# Patient Record
Sex: Female | Born: 1955 | Race: White | Hispanic: No
Health system: Midwestern US, Academic
[De-identification: ages and names within clinical notes are randomized; demographics above are authoritative.]

---

## 2016-07-20 ENCOUNTER — Encounter: Admit: 2016-07-20 | Discharge: 2016-07-20 | Payer: No Typology Code available for payment source

## 2016-09-07 ENCOUNTER — Encounter: Admit: 2016-09-07 | Discharge: 2016-09-07 | Payer: No Typology Code available for payment source

## 2016-09-07 ENCOUNTER — Ambulatory Visit: Admit: 2016-09-07 | Discharge: 2016-09-08 | Payer: No Typology Code available for payment source

## 2016-09-07 DIAGNOSIS — Z780 Asymptomatic menopausal state: Principal | ICD-10-CM

## 2016-09-07 DIAGNOSIS — J301 Allergic rhinitis due to pollen: ICD-10-CM

## 2016-09-07 DIAGNOSIS — G43909 Migraine, unspecified, not intractable, without status migrainosus: ICD-10-CM

## 2016-09-07 DIAGNOSIS — Z1239 Encounter for other screening for malignant neoplasm of breast: ICD-10-CM

## 2016-09-07 DIAGNOSIS — Z79890 Hormone replacement therapy (postmenopausal): ICD-10-CM

## 2016-09-07 DIAGNOSIS — Z0000 Encounter for general adult medical examination without abnormal findings: ICD-10-CM

## 2016-09-07 MED ORDER — TRAMADOL 50 MG PO TAB
50 mg | ORAL_TABLET | Freq: Every day | ORAL | 0 refills | Status: AC | PRN
Start: 2016-09-07 — End: 2017-11-15

## 2016-09-07 MED ORDER — CONJ ESTROG-MEDROXYPROGEST ACE 0.3-1.5 MG PO TAB
1 | ORAL_TABLET | Freq: Every morning | ORAL | 1 refills | Status: AC
Start: 2016-09-07 — End: 2016-12-26

## 2016-09-07 MED ORDER — TOPIRAMATE 100 MG PO TAB
100 mg | ORAL_TABLET | Freq: Two times a day (BID) | ORAL | 0 refills | Status: AC
Start: 2016-09-07 — End: 2017-06-24

## 2016-09-08 LAB — COMPREHENSIVE METABOLIC PANEL
Lab: 0.5 mg/dL (ref 0.2–1.2)
Lab: 1.6 (calc) (ref 1.0–2.5)
Lab: 104 mmol/L (ref 98–110)
Lab: 116 mg/dL — ABNORMAL HIGH (ref 65–99)
Lab: 2.8 g/dL (ref 1.9–3.7)
Lab: 23 U/L (ref 6–29)
Lab: 27 U/L (ref 10–35)
Lab: 27 mmol/L (ref 20–31)
Lab: 4 mmol/L (ref 3.5–5.3)
Lab: 69 U/L (ref 33–130)
Lab: 7.4 g/dL (ref 6.1–8.1)
Lab: 9.9 mg/dL (ref 8.6–10.4)

## 2016-09-08 LAB — LIPID PROFILE
Lab: 128 mg/dL — ABNORMAL HIGH (ref >=60)
Lab: 165 mg/dL — ABNORMAL HIGH (ref <130)
Lab: 232 mg/dL — ABNORMAL HIGH (ref <150)
Lab: 236 mg/dL — ABNORMAL HIGH (ref <200)
Lab: 3.3 (calc) (ref <5.0)
Lab: 71 mg/dL (ref >50)

## 2016-09-18 ENCOUNTER — Encounter: Admit: 2016-09-18 | Discharge: 2016-09-18 | Payer: No Typology Code available for payment source

## 2016-09-18 DIAGNOSIS — Z83518 Family history of other specified eye disorder: Principal | ICD-10-CM

## 2016-09-18 NOTE — Telephone Encounter
If patient has an appointment she should not need a referral. If the office says she dose I am happy to place one but do not think she will need it. Please let me know thanks.What is the eye appointment for?

## 2016-09-18 NOTE — Telephone Encounter
Pt left VM requesting a referral for her eye appt with Dr. Karyl Kinnier at [facility] office) on Friday, 09/22/16. Routing to PCP. Please advise.   Margarita Rana, LPN

## 2016-09-19 ENCOUNTER — Encounter: Admit: 2016-09-19 | Discharge: 2016-09-19 | Payer: No Typology Code available for payment source

## 2016-09-19 NOTE — Telephone Encounter
Nurse spoke with pt. The eye dr told her to get a referral. Pt's mother has degenerative eye disease and is vision impaired. Pt has [place] [employer] and has a difficult time getting things covered.   Routing to PCP.   Margarita Rana, LPN

## 2016-09-19 NOTE — Telephone Encounter
Will place eye referral. Melinda Goodman can we please print the referral and ask where patient would like to pick it up or send it to?Thank you.

## 2016-09-19 NOTE — Telephone Encounter
Nurse called pt to notify her scripts were called in to pharmacy. Also to educate pt to not break Topiramate tablets but to take 1 tablet every 12 hrs. Pt verbalized understanding.   Margarita Rana, LPN

## 2016-09-28 ENCOUNTER — Ambulatory Visit: Admit: 2016-09-28 | Discharge: 2016-09-29 | Payer: No Typology Code available for payment source

## 2016-09-28 ENCOUNTER — Encounter: Admit: 2016-09-28 | Discharge: 2016-09-28 | Payer: No Typology Code available for payment source

## 2016-09-28 DIAGNOSIS — Z79890 Hormone replacement therapy (postmenopausal): ICD-10-CM

## 2016-09-28 DIAGNOSIS — G43009 Migraine without aura, not intractable, without status migrainosus: ICD-10-CM

## 2016-09-28 DIAGNOSIS — Z0000 Encounter for general adult medical examination without abnormal findings: ICD-10-CM

## 2016-09-28 DIAGNOSIS — Z124 Encounter for screening for malignant neoplasm of cervix: Principal | ICD-10-CM

## 2016-09-28 DIAGNOSIS — N898 Other specified noninflammatory disorders of vagina: ICD-10-CM

## 2016-09-28 NOTE — Progress Notes
Subjective:       History of Present Illness  Melinda Goodman is a 61 y.o. female with PMH of allergic rhinitis, DJD, migraines and non small cell lung cancer who presents to clinic today for her well woman exam.     Patient states that she is doing fine. She states that she has finished her boyfriends move. He is now living with her. She feels it is going well. She states that she is exercising doing water aerobics. She states that they have two dogs now in the house.    She states that she has been on prempro for >5 years. She states that she is worried about what symptoms might develop should she stop the prempro. She denies any nipple discharge, vaginal drainage, bleeding, breast lumps, rashes, lesions and denies any pain with intercourse.     In terms of her headaches she states that they are a little bit better on the topamax now but recently have been a little more frequent. She states that she has the same amount of caffeine every day but states that she has been sleeping less lately. She states that this is due to a different mattress.        Review of Systems   Constitutional: Negative for chills and fever.   Respiratory: Negative for shortness of breath.    Cardiovascular: Negative for chest pain.   Gastrointestinal: Negative for abdominal pain.   Genitourinary: Negative for menstrual problem, pelvic pain, vaginal bleeding, vaginal discharge and vaginal pain.     Past Medical History:   Diagnosis Date   ??? Allergic rhinitis 03/19/2006   ??? Cancer of lung (HCC)    ??? DJD (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   ??? HPV (human papillomavirus)     HISTORY OF   ??? Menopause 11/2003    HRT   ??? Migraine headache 03/19/2006   ??? Mood disorder (HCC) 03/19/2006   ??? Non-small cell lung cancer (HCC)          Objective:         ??? aspirin/acetaminophen/caffeine(+) (EXCEDRIN MIGRAINE) 250/250/65 mg tab Take 1 tablet by mouth.   ??? bran/gum/fib/cel/psyl/kelp/pec (FIBER 6 PO) Take  by mouth. ??? docosahexanoic acid/epa (FISH OIL PO) Take  by mouth.   ??? estrogens, conj/medroxyPROGESTERone(+) (PREMPRO) 0.3/1.5 mg tablet Take 1 tablet by mouth every morning.   ??? fexofenadine(+) (ALLEGRA) 180 mg PO tablet Take 180 mg by mouth Every Morning.   ??? glucosamine(+) 500 mg tab Take 500 mg by mouth three times daily with meals.   ??? IBUPROFEN PO Take  by mouth.   ??? ibuprofen/diphenhydramine cit (ADVIL PM PO) Take  by mouth.   ??? miconazole (MICONAZOLE 7) 2 % vaginal cream Apply 1 applicator topically to affected area at bedtime daily.   ??? topiramate (TOPAMAX) 100 mg tablet Take 1 tablet by mouth every 12 hours.   ??? traMADol (ULTRAM) 50 mg tablet Take 1 tablet by mouth daily as needed for Pain.     Vitals:    09/28/16 0824   BP: (!) 133/104   Pulse: 89   Resp: 13   Temp: 36.6 ???C (97.8 ???F)   TempSrc: Oral   Weight: 70.4 kg (155 lb 3.2 oz)   Height: 167.6 cm (66)     Body mass index is 25.05 kg/m???.     Physical Exam   Constitutional: She is oriented to person, place, and time. She appears well-developed and well-nourished. No distress.   HENT:   Head:  Normocephalic and atraumatic.   Cardiovascular: Normal rate, regular rhythm, normal heart sounds and intact distal pulses.    No murmur heard.  Pulmonary/Chest: Effort normal and breath sounds normal. No respiratory distress. She has no wheezes. She exhibits no mass and no tenderness. Right breast exhibits no inverted nipple, no mass, no nipple discharge, no skin change and no tenderness. Left breast exhibits no inverted nipple, no mass, no nipple discharge, no skin change and no tenderness.   Abdominal: Bowel sounds are normal.   Genitourinary: There is no rash or lesion on the right labia. There is no rash or lesion on the left labia. Uterus is not tender. Cervix exhibits no motion tenderness and no friability. Right adnexum displays no tenderness. Left adnexum displays no tenderness. Vaginal discharge (white discharge) found.   Musculoskeletal: She exhibits no edema. Neurological: She is alert and oriented to person, place, and time.   Skin: Skin is warm. She is not diaphoretic.   well healed incision of R axillae from prior biopsy    Psychiatric: She has a normal mood and affect. Her behavior is normal.            Assessment and Plan:    Problem   Hormone Replacement Therapy (Postmenopausal)    - longstanding use of PREMPRO (conj estrogens/medroxyPROGESTERONE)  - improves menstrual migraines and fatigue level as well as vaginal irritation/dryness  PLAN  - discussed potential side effects of therapy as well as possible decrease risk if we switch to transdermal however patient wanted to stay on prempro despite risks however she is willing to start taper now  -will decrease prempro to one tablet every other day for the next three months and then plan to space out further      Healthcare Maintenance    [name] is a 61 y.o. female with history of migraine headaches.     Age appropriate anticipatory guidance given  Vaccinations: up to date per patient  Colon Cancer screening: due in 2022  Breast cancer screening: due, mammogram ordered  Cervical Cancer screening: only one time abnormal, colposcopy was normal. pap smear completed today   Lung Cancer screening: history of lung cancer - cleared by Dr. Ladona Ridgel  Osteoporosis: DEXA due at age 24  Tobacco/Alcohol: not at risk user  HTN/HLD/DM: previously screened, blood pressure slightly elevated today however all prior blood pressures have been well controlled   Hepatitis: previously screened negative  HIV: declined, low risk  AAA: not a candidate  Hearing Loss: denies  *discussed with patient that she is due for shingles vaccine       Migraine Headache    - prior rx: topamax, effective at dose 200mg  in AM and 100mg  QHS.  - could not get insurance coverage for topamax .   - did have worsening headaches and fatigue off HRT  -09/28/16 improved back on topamax   PLAN   cont topamax 100mg  twice daily > patient has been counseled on risks of HRT we discussed again today and she is agreeable to start tapering off   > continue abortive therapy for migraines, patient taking tramadol rarely (script provided in 09/2016)--will not plan on giving any further for 6 months   -feel patient would also benefit from Neurology referral, however in discussion with patient today she states that she does not want to see Neurology at this time                Orders Placed This Encounter   ??? VAGINITIS  PANEL DNA PROBE QUEST   ??? CYTOLOGY PAP SMEAR  SCREENING today     Will have patient RTC in 6 months or sooner if needed for migraine follow up.       Patient Instructions           Thank you for coming to clinic today!  Make sure that you get your mammogram.  Will release your results through [facility] taking your prempro every OTHER day.  Would recommend getting a new mattress.  Your are due for shingles and tetanus shot. you can get either of these at your pharmacy or call our office to set up a nurse appointment.   If you have any questions or concerns, please feel free to call my nurse Burna Sis at [phone]  Take care,   Dr. Ellis Savage          On behalf of your physician and our staff, we are committed to providing you the best care we can.   In case of urgent care needs, we want bto share with you some options:    Appointments: We now offer SAME DAY appointments for all patients. Call [phone] for an appointment during business hours.   Office Hours:  Our office hours are 8:00 AM to 4:30 PM, Monday through Friday.  Illnesses: In the event of illness, please call your primary care physician prior to visiting an Urgent Care facility or Emergency Room as they can direct you to proper level of care. [facility] offers an After Hours Clinic for patients who need immediate care.  [facility]???s switchboard [phone]) is available to answer your urgent calls 24/7. In the event of an emergency, you should always call 911. After Hours:  In order to extend our primary care services beyond normal work hours, [facility] offer medical services in multiple After Hours Clinic locations.  Any General Medicine patient who needs immediate care can be seen at several Locations.    [facility]     [address], [place], [place], [place] [address]  [phone]  M-F 5:30 p.m.-9 p.m.  Sat-Sun 9 a.m.-1 p.m.    [facility]  [address] [place], [place] [address]  [phone]  M-F 9a.m.-9 p.m.  Sat-Sun 8 a.m.-4 p.m    [facility]    [address], [place], [place] [address]   [phone]  M-F 5 p.m.-9 p.m.  Sat-Sun 8 a.m.-2 p.m    [facility]  [address] [place], [place] [address]  [phone]  M-F 8 a.m.-5 p.m.  Sat-Sun CLOSED    After Hours Clinics Offer our Patients numerous benefits.   Providers at our After Hours Clinics have access to your electronic chart  Appointments may be scheduled to minimize wait time  Walk-ins also accommodated  Co-pays may be considerably less than those at Urgent Care center or an Emergency Room    [facility] After Hours Clinics also have a holiday schedule.  Please visit the Office Hours page at:   [email or web address]   for a list of holiday hours.  We look forward to partnering with you to help you achieve and maintain optimum health.    Sincerely, General Internal Medicine Clinic, [facility]. [phone]      No future appointments.

## 2016-10-04 ENCOUNTER — Encounter: Admit: 2016-10-04 | Discharge: 2016-10-04 | Payer: No Typology Code available for payment source

## 2016-10-06 ENCOUNTER — Encounter: Admit: 2016-10-06 | Discharge: 2016-10-06 | Payer: No Typology Code available for payment source

## 2016-10-06 DIAGNOSIS — Z124 Encounter for screening for malignant neoplasm of cervix: Principal | ICD-10-CM

## 2016-10-23 ENCOUNTER — Encounter: Admit: 2016-10-23 | Discharge: 2016-10-23 | Payer: No Typology Code available for payment source

## 2016-12-04 ENCOUNTER — Ambulatory Visit: Admit: 2016-12-04 | Discharge: 2016-12-04 | Payer: No Typology Code available for payment source

## 2016-12-04 ENCOUNTER — Encounter: Admit: 2016-12-04 | Discharge: 2016-12-04 | Payer: No Typology Code available for payment source

## 2016-12-04 ENCOUNTER — Ambulatory Visit: Admit: 2016-12-04 | Discharge: 2016-12-05 | Payer: No Typology Code available for payment source

## 2016-12-04 DIAGNOSIS — G43909 Migraine, unspecified, not intractable, without status migrainosus: Principal | ICD-10-CM

## 2016-12-04 DIAGNOSIS — M25521 Pain in right elbow: ICD-10-CM

## 2016-12-04 DIAGNOSIS — M7711 Lateral epicondylitis, right elbow: Principal | ICD-10-CM

## 2016-12-04 DIAGNOSIS — M7701 Medial epicondylitis, right elbow: Principal | ICD-10-CM

## 2016-12-04 DIAGNOSIS — F39 Unspecified mood [affective] disorder: ICD-10-CM

## 2016-12-04 DIAGNOSIS — J309 Allergic rhinitis, unspecified: ICD-10-CM

## 2016-12-04 DIAGNOSIS — Z780 Asymptomatic menopausal state: ICD-10-CM

## 2016-12-04 DIAGNOSIS — M1990 Unspecified osteoarthritis, unspecified site: ICD-10-CM

## 2016-12-04 DIAGNOSIS — Z1239 Encounter for other screening for malignant neoplasm of breast: ICD-10-CM

## 2016-12-04 DIAGNOSIS — C3490 Malignant neoplasm of unspecified part of unspecified bronchus or lung: Secondary | ICD-10-CM

## 2016-12-04 DIAGNOSIS — Z23 Encounter for immunization: ICD-10-CM

## 2016-12-04 NOTE — Progress Notes
Date of Service: 12/04/2016    Melinda Goodman is a 61 y.o. female. DOB: [birth year]   MRN#: [id number]    Subjective:   Chief Complaint   Patient presents with   ??? Elbow Pain          History of Present Illness  Melinda Goodman is 61 y.o. patient who presents to clinic for urgent visit.    She is having pain in her right elbow.  She started golfing over the summer and the pain gradually started then.  It has gotten worse in the last month.  The pain sometimes wakes her up at night.  She has been applying heat and taking Advil or Aleve as needed.  She still has full range of motion and has still been physically active.  She played tennis when she was younger and had similar pain when she had tennis elbow.  The pain sometimes travels down her lower arm to her wrist.      Past Medical History:   Diagnosis Date   ??? Allergic rhinitis 03/19/2006   ??? Cancer of lung (HCC)    ??? DJD (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   ??? HPV (human papillomavirus)     HISTORY OF   ??? Menopause 11/2003    HRT   ??? Migraine headache 03/19/2006   ??? Mood disorder (HCC) 03/19/2006   ??? Non-small cell lung cancer [facility])        Past Surgical History:   Procedure Laterality Date   ??? HX PNEUMONECTOMY     ??? HX TONSILLECTOMY     ??? SHOULDER SURGERY         family history includes Cancer-Prostate in her father.    Social History     Social History   ??? Marital status: Divorced     Spouse name: N/A   ??? Number of children: N/A   ??? Years of education: N/A     Occupational History   ??? teacher, diving [employer]     Social History Main Topics   ??? Smoking status: Never Smoker   ??? Smokeless tobacco: Never Used      Comment: Patient expieremented in college   ??? Alcohol use Yes      Comment: RARE   ??? Drug use: No      Comment: HX: Marijuana   ??? Sexual activity: Yes     Partners: Male     Birth control/ protection: Post-menopausal     Other Topics Concern   ??? Not on file     Social History Narrative   ??? No narrative on file       Social History Substance Use Topics   ??? Smoking status: Never Smoker   ??? Smokeless tobacco: Never Used      Comment: Patient expieremented in college   ??? Alcohol use Yes      Comment: RARE            Review of Systems   Constitution: Negative for chills and fever.   Cardiovascular: Negative for chest pain and dyspnea on exertion.   Respiratory: Negative for cough and shortness of breath.    Musculoskeletal: Positive for joint pain, muscle weakness and myalgias. Negative for falls and joint swelling.   Neurological: Negative for dizziness and headaches.   All other systems reviewed and are negative.        Objective:     ??? aspirin/acetaminophen/caffeine(+) (EXCEDRIN MIGRAINE) 250/250/65 mg tab Take 1 tablet by mouth.   ???  bran/gum/fib/cel/psyl/kelp/pec (FIBER 6 PO) Take  by mouth.   ??? docosahexanoic acid/epa (FISH OIL PO) Take  by mouth.   ??? estrogens, conj/medroxyPROGESTERone(+) (PREMPRO) 0.3/1.5 mg tablet Take 1 tablet by mouth every morning.   ??? fexofenadine(+) (ALLEGRA) 180 mg PO tablet Take 180 mg by mouth Every Morning.   ??? glucosamine(+) 500 mg tab Take 500 mg by mouth three times daily with meals.   ??? IBUPROFEN PO Take  by mouth.   ??? ibuprofen/diphenhydramine cit (ADVIL PM PO) Take  by mouth.   ??? topiramate (TOPAMAX) 100 mg tablet Take 1 tablet by mouth every 12 hours.   ??? traMADol (ULTRAM) 50 mg tablet Take 1 tablet by mouth daily as needed for Pain.     Vitals:    12/04/16 1336   BP: 117/82   Pulse: 97   Resp: 16   Temp: 36.4 ???C (97.6 ???F)   TempSrc: Oral   Weight: 71.8 kg (158 lb 6.4 oz)   Height: 167.6 cm (66)     Body mass index is 25.57 kg/m???.     BP Readings from Last 5 Encounters:   12/04/16 117/82   09/28/16 (!) 133/104   09/07/16 127/82   04/13/16 123/87   02/17/16 139/84     Wt Readings from Last 5 Encounters:   12/04/16 71.8 kg (158 lb 6.4 oz)   09/28/16 70.4 kg (155 lb 3.2 oz)   09/07/16 69.9 kg (154 lb 3.2 oz)   04/13/16 66.5 kg (146 lb 9.6 oz)   02/17/16 66 kg (145 lb 6.4 oz)         Physical Exam Constitutional: She is oriented to person, place, and time. She appears well-developed and well-nourished.   HENT:   Head: Normocephalic and atraumatic.   Neck: Normal range of motion.   Cardiovascular: Normal rate, regular rhythm, normal heart sounds and intact distal pulses.    Pulmonary/Chest: Effort normal and breath sounds normal. No respiratory distress.   Musculoskeletal: Normal range of motion.        Right elbow: She exhibits normal range of motion, no swelling, no effusion, no deformity and no laceration. Tenderness found. Lateral epicondyle tenderness noted.   Neurological: She is alert and oriented to person, place, and time.   Skin: Skin is warm and dry.   Psychiatric: She has a normal mood and affect. Her behavior is normal.   Nursing note and vitals reviewed.      Greater than 25 minutes spent with pt with >50% of the time counseling and coordinating care.  This consultation time included discussion of medical issues, interpretation of data and educating patient as to condition and treatment options (and potential risks and benefits).         Assessment and Plan:    Lateral epicondylitis, right elbow  Right elbow pain  ??? X-ray right elbow today  ??? Referral placed to physical therapy  ??? Continue NSAIDs, stretching, ice, and heat    Need for immunization against influenza  ??? - FLU VACCINE =>6 MONTHS QUADRIVALENT PF    Breast cancer screening  ??? Order placed for routine mammogram per patient request    Orders Placed This Encounter   ??? ELBOW MIN 3 VIEWS RIGHT   ??? MAMMO SCREEN BILAT   ??? influenza (=>6 months) QUADrivalent (FLULAVAL, FLUARIX) vaccine PF   ??? PHYSICAL THERAPY     Future Appointments  Date Time Provider Department Center   12/04/2016 2:30 PM GENERAL ROOM [facility]  Patient Instructions   It was nice to see you today! Thank you for coming into our clinic.      Our plan:  ??? Physical therapy ordered  ??? Continue ibuprofen/naproxen ??? Xray today. Stop by the 2nd floor on your way out. I will call or MyChart you regarding the results in the next few days.         Please notify the clinic or go to the emergency room if you develop sudden worsening of symptoms, fevers, chest pain, difficulty breathing, or other concerning symptoms.     Please call my nurse if you have any further questions or concerns. Her name is Leotis Shames, and her direct phone number is [phone]. You can leave a voicemail if needed.  You can also reach [place] by sending a secure email message through MyChart.    Helpful Information:  ??? We will contact you by MyChart or phone with any test results when they are available.   ??? If you are expecting results and have not heard from [place] within 2 weeks of your testing, please send a MyChart message or call the clinic.  ??? Please use the MyChart Refill request or contact your pharmacy directly to request medication refills. - Please allow at least 72 hours for refill requests.  ??? For appointments: Our Scheduling phone number is [phone].        Your satisfaction with the care you received today is important to me. Please call us directly at [phone] if there are any concerns that we can address.       Again, it was great to see you today.    Take care,  Linna Caprice, APRN        No future appointments.          Linna Caprice, APRN-NP

## 2016-12-05 ENCOUNTER — Encounter: Admit: 2016-12-05 | Discharge: 2016-12-05 | Payer: No Typology Code available for payment source

## 2016-12-05 NOTE — Telephone Encounter
"  Please notify patient that her xray looks normal   Thank you!   Ellison Hughs "    Nurse relayed message. Pt understood. Nurse informed pt to call [phone] or to send message through [facility], if there are future questions or concerns.  Marjory Lies, LPN

## 2016-12-08 ENCOUNTER — Encounter: Admit: 2016-12-08 | Discharge: 2016-12-08 | Payer: No Typology Code available for payment source

## 2016-12-08 DIAGNOSIS — M7711 Lateral epicondylitis, right elbow: ICD-10-CM

## 2016-12-08 DIAGNOSIS — M7701 Medial epicondylitis, right elbow: Principal | ICD-10-CM

## 2016-12-08 DIAGNOSIS — M25521 Pain in right elbow: ICD-10-CM

## 2016-12-11 ENCOUNTER — Ambulatory Visit: Admit: 2016-12-11 | Discharge: 2016-12-11 | Payer: No Typology Code available for payment source

## 2016-12-11 DIAGNOSIS — Z1231 Encounter for screening mammogram for malignant neoplasm of breast: Principal | ICD-10-CM

## 2016-12-18 ENCOUNTER — Encounter: Admit: 2016-12-18 | Discharge: 2016-12-18 | Payer: No Typology Code available for payment source

## 2016-12-18 DIAGNOSIS — Z780 Asymptomatic menopausal state: Principal | ICD-10-CM

## 2016-12-18 MED ORDER — PREMPRO 0.3-1.5 MG PO TAB
ORAL_TABLET | Freq: Every morning | 1 refills
Start: 2016-12-18 — End: ?

## 2016-12-20 ENCOUNTER — Ambulatory Visit: Admit: 2016-12-20 | Discharge: 2016-12-20 | Payer: No Typology Code available for payment source

## 2016-12-22 ENCOUNTER — Encounter: Admit: 2016-12-22 | Discharge: 2016-12-22 | Payer: No Typology Code available for payment source

## 2016-12-22 DIAGNOSIS — Z780 Asymptomatic menopausal state: Principal | ICD-10-CM

## 2016-12-22 NOTE — Telephone Encounter
Pt's pharmacy requesting a refill for Prempro 0.3-1.5mg  tablet #90x1. Last filled 09/07/16 #90x1. Last office visit 09/28/16. No future visit scheduled. Medication not on standing orders.   Routing to PCP for approval.  Margarita Rana, LPN

## 2016-12-24 NOTE — Telephone Encounter
Melinda Goodman, can we please ask patient how she has been doing taking her medication every other day? We were hoping for her to taper off of this, as this is what would be safest for her. THank you.

## 2016-12-25 MED ORDER — CONJ ESTROG-MEDROXYPROGEST ACE 0.3-1.5 MG PO TAB
1 | ORAL_TABLET | ORAL | 0 refills | Status: AC
Start: 2016-12-25 — End: 2017-03-01

## 2016-12-25 NOTE — Telephone Encounter
Nurse called pt and left vm relaying previous message and requesting pt return call.   Margarita Rana, LPN

## 2016-12-25 NOTE — Telephone Encounter
Pt left vm stating she is fine taking the Prempro every other day.   Margarita Rana, LPN

## 2016-12-26 NOTE — Telephone Encounter
Please ask patient to try and taper further if she is able to, as I would like her to be off of this as soon as possible.

## 2016-12-26 NOTE — Telephone Encounter
Nurse called pt and relayed previous message. Pt does not want to taper off any more at this point. Pt stated she tried to get off of it but had too many symptoms. Pt would like to stay on the every other day cycle for a while.  Margarita Rana, LPN

## 2017-01-04 ENCOUNTER — Ambulatory Visit: Admit: 2017-01-04 | Discharge: 2017-01-04 | Payer: No Typology Code available for payment source

## 2017-01-10 ENCOUNTER — Ambulatory Visit: Admit: 2017-01-10 | Discharge: 2017-01-10 | Payer: No Typology Code available for payment source

## 2017-01-19 ENCOUNTER — Ambulatory Visit: Admit: 2017-01-19 | Discharge: 2017-01-19 | Payer: No Typology Code available for payment source

## 2017-01-23 ENCOUNTER — Ambulatory Visit: Admit: 2017-01-23 | Discharge: 2017-01-23 | Payer: No Typology Code available for payment source

## 2017-02-28 ENCOUNTER — Encounter: Admit: 2017-02-28 | Discharge: 2017-02-28 | Payer: No Typology Code available for payment source

## 2017-02-28 DIAGNOSIS — Z780 Asymptomatic menopausal state: Principal | ICD-10-CM

## 2017-03-01 MED ORDER — CONJ ESTROG-MEDROXYPROGEST ACE 0.3-1.5 MG PO TAB
ORAL_TABLET | ORAL | 0 refills | Status: AC
Start: 2017-03-01 — End: ?

## 2017-03-19 ENCOUNTER — Encounter: Admit: 2017-03-19 | Discharge: 2017-03-19 | Payer: No Typology Code available for payment source

## 2017-03-20 ENCOUNTER — Encounter: Admit: 2017-03-20 | Discharge: 2017-03-20 | Payer: No Typology Code available for payment source

## 2017-03-20 ENCOUNTER — Ambulatory Visit: Admit: 2017-03-20 | Discharge: 2017-03-21 | Payer: Private Health Insurance - Indemnity

## 2017-03-20 DIAGNOSIS — C3490 Malignant neoplasm of unspecified part of unspecified bronchus or lung: ICD-10-CM

## 2017-03-20 DIAGNOSIS — Z780 Asymptomatic menopausal state: ICD-10-CM

## 2017-03-20 DIAGNOSIS — F39 Unspecified mood [affective] disorder: ICD-10-CM

## 2017-03-20 DIAGNOSIS — M7711 Lateral epicondylitis, right elbow: ICD-10-CM

## 2017-03-20 DIAGNOSIS — J309 Allergic rhinitis, unspecified: ICD-10-CM

## 2017-03-20 DIAGNOSIS — M1990 Unspecified osteoarthritis, unspecified site: ICD-10-CM

## 2017-03-20 DIAGNOSIS — G43909 Migraine, unspecified, not intractable, without status migrainosus: Principal | ICD-10-CM

## 2017-03-20 DIAGNOSIS — Z79890 Hormone replacement therapy (postmenopausal): ICD-10-CM

## 2017-03-20 DIAGNOSIS — R232 Flushing: Principal | ICD-10-CM

## 2017-03-20 MED ORDER — VENLAFAXINE 37.5 MG PO TAB
37.5 mg | ORAL_TABLET | Freq: Every day | ORAL | 0 refills | Status: AC
Start: 2017-03-20 — End: 2017-06-25

## 2017-06-20 ENCOUNTER — Encounter: Admit: 2017-06-20 | Discharge: 2017-06-20 | Payer: No Typology Code available for payment source

## 2017-06-20 DIAGNOSIS — G43709 Chronic migraine without aura, not intractable, without status migrainosus: Principal | ICD-10-CM

## 2017-06-24 MED ORDER — TOPIRAMATE 100 MG PO TAB
100 mg | ORAL_TABLET | Freq: Two times a day (BID) | ORAL | 0 refills | Status: AC
Start: 2017-06-24 — End: 2017-11-15

## 2017-06-25 ENCOUNTER — Encounter: Admit: 2017-06-25 | Discharge: 2017-06-25 | Payer: No Typology Code available for payment source

## 2017-06-25 DIAGNOSIS — R232 Flushing: Principal | ICD-10-CM

## 2017-06-25 MED ORDER — VENLAFAXINE 37.5 MG PO TAB
37.5 mg | ORAL_TABLET | Freq: Every day | ORAL | 0 refills | Status: AC
Start: 2017-06-25 — End: 2017-11-15

## 2017-11-15 ENCOUNTER — Encounter: Admit: 2017-11-15 | Discharge: 2017-11-15 | Payer: No Typology Code available for payment source

## 2017-11-15 DIAGNOSIS — G43009 Migraine without aura, not intractable, without status migrainosus: Secondary | ICD-10-CM

## 2017-11-15 DIAGNOSIS — M7711 Lateral epicondylitis, right elbow: ICD-10-CM

## 2017-11-15 DIAGNOSIS — F419 Anxiety disorder, unspecified: Secondary | ICD-10-CM

## 2017-11-15 DIAGNOSIS — F39 Unspecified mood [affective] disorder: Secondary | ICD-10-CM

## 2017-11-15 DIAGNOSIS — G43909 Migraine, unspecified, not intractable, without status migrainosus: Principal | ICD-10-CM

## 2017-11-15 DIAGNOSIS — B977 Papillomavirus as the cause of diseases classified elsewhere: ICD-10-CM

## 2017-11-15 DIAGNOSIS — Z8669 Personal history of other diseases of the nervous system and sense organs: ICD-10-CM

## 2017-11-15 DIAGNOSIS — Z780 Asymptomatic menopausal state: ICD-10-CM

## 2017-11-15 DIAGNOSIS — M1990 Unspecified osteoarthritis, unspecified site: ICD-10-CM

## 2017-11-15 DIAGNOSIS — H902 Conductive hearing loss, unspecified: ICD-10-CM

## 2017-11-15 DIAGNOSIS — E785 Hyperlipidemia, unspecified: ICD-10-CM

## 2017-11-15 DIAGNOSIS — N390 Urinary tract infection, site not specified: ICD-10-CM

## 2017-11-15 DIAGNOSIS — R232 Flushing: ICD-10-CM

## 2017-11-15 DIAGNOSIS — J302 Other seasonal allergic rhinitis: ICD-10-CM

## 2017-11-15 DIAGNOSIS — C3490 Malignant neoplasm of unspecified part of unspecified bronchus or lung: Secondary | ICD-10-CM

## 2017-11-15 MED ORDER — RIBOFLAVIN (VITAMIN B2) 400 MG PO TAB
400 mg | ORAL_TABLET | Freq: Every day | ORAL | 1 refills | Status: AC
Start: 2017-11-15 — End: 2018-11-18

## 2017-11-15 MED ORDER — TOPIRAMATE 100 MG PO TAB
100 mg | ORAL_TABLET | Freq: Two times a day (BID) | ORAL | 3 refills | Status: AC
Start: 2017-11-15 — End: 2018-11-18

## 2017-11-15 MED ORDER — ESCITALOPRAM OXALATE 10 MG PO TAB
10 mg | ORAL_TABLET | Freq: Every day | ORAL | 1 refills | Status: AC
Start: 2017-11-15 — End: 2018-05-14

## 2017-11-15 MED ORDER — VARICELLA-ZOSTER GE-AS01B (PF) 50 MCG/0.5 ML IM SUSR
.5 mL | Freq: Once | INTRAMUSCULAR | 1 refills | Status: AC
Start: 2017-11-15 — End: ?

## 2017-11-15 MED ORDER — SUMATRIPTAN SUCCINATE 25 MG PO TAB
25 mg | ORAL_TABLET | ORAL | 5 refills | Status: AC | PRN
Start: 2017-11-15 — End: 2018-11-18

## 2017-11-16 ENCOUNTER — Ambulatory Visit: Admit: 2017-11-15 | Discharge: 2017-11-16 | Payer: Private Health Insurance - Indemnity

## 2017-11-16 DIAGNOSIS — J302 Other seasonal allergic rhinitis: ICD-10-CM

## 2017-11-16 DIAGNOSIS — Z8669 Personal history of other diseases of the nervous system and sense organs: ICD-10-CM

## 2017-11-16 DIAGNOSIS — B977 Papillomavirus as the cause of diseases classified elsewhere: ICD-10-CM

## 2017-11-16 DIAGNOSIS — Z114 Encounter for screening for human immunodeficiency virus [HIV]: ICD-10-CM

## 2017-11-16 DIAGNOSIS — J301 Allergic rhinitis due to pollen: ICD-10-CM

## 2017-11-16 DIAGNOSIS — C3431 Malignant neoplasm of lower lobe, right bronchus or lung: ICD-10-CM

## 2017-11-16 DIAGNOSIS — E785 Hyperlipidemia, unspecified: ICD-10-CM

## 2017-11-16 DIAGNOSIS — Z23 Encounter for immunization: ICD-10-CM

## 2017-11-16 DIAGNOSIS — Z79890 Hormone replacement therapy (postmenopausal): ICD-10-CM

## 2017-11-16 DIAGNOSIS — F5105 Insomnia due to other mental disorder: ICD-10-CM

## 2017-11-16 DIAGNOSIS — H902 Conductive hearing loss, unspecified: ICD-10-CM

## 2017-11-16 DIAGNOSIS — R232 Flushing: ICD-10-CM

## 2017-11-16 DIAGNOSIS — Z0000 Encounter for general adult medical examination without abnormal findings: Principal | ICD-10-CM

## 2017-11-16 DIAGNOSIS — Z1239 Encounter for other screening for malignant neoplasm of breast: ICD-10-CM

## 2017-11-16 DIAGNOSIS — Z01419 Encounter for gynecological examination (general) (routine) without abnormal findings: ICD-10-CM

## 2017-11-16 DIAGNOSIS — G43709 Chronic migraine without aura, not intractable, without status migrainosus: ICD-10-CM

## 2017-11-16 DIAGNOSIS — G43109 Migraine with aura, not intractable, without status migrainosus: ICD-10-CM

## 2017-11-16 DIAGNOSIS — M150 Primary generalized (osteo)arthritis: ICD-10-CM

## 2017-11-19 ENCOUNTER — Ambulatory Visit: Admit: 2017-11-19 | Discharge: 2017-11-19 | Payer: Private Health Insurance - Indemnity

## 2017-11-19 DIAGNOSIS — Z01419 Encounter for gynecological examination (general) (routine) without abnormal findings: Principal | ICD-10-CM

## 2017-11-19 LAB — LIPID PROFILE
Lab: 188 mg/dL — ABNORMAL HIGH (ref <100)
Lab: 221 mg/dL — ABNORMAL HIGH (ref <150)
Lab: 233 mg/dL (ref 6.0–8.0)
Lab: 288 mg/dL — ABNORMAL HIGH (ref <200)

## 2017-11-19 LAB — COMPREHENSIVE METABOLIC PANEL
Lab: 0.4 mg/dL (ref 0.3–1.2)
Lab: 143 MMOL/L (ref 137–147)
Lab: 4.1 MMOL/L (ref 3.5–5.1)
Lab: 4.5 g/dL (ref 3.5–5.0)
Lab: 85 U/L (ref 25–110)

## 2017-11-19 LAB — TSH WITH FREE T4 REFLEX: Lab: 3.5 uU/mL (ref >40)

## 2018-02-20 ENCOUNTER — Ambulatory Visit: Admit: 2018-02-20 | Discharge: 2018-02-20 | Payer: No Typology Code available for payment source

## 2018-02-20 DIAGNOSIS — Z1231 Encounter for screening mammogram for malignant neoplasm of breast: Secondary | ICD-10-CM

## 2018-05-14 ENCOUNTER — Encounter: Admit: 2018-05-14 | Discharge: 2018-05-14 | Payer: No Typology Code available for payment source

## 2018-05-14 MED ORDER — ESCITALOPRAM OXALATE 10 MG PO TAB
10 mg | ORAL_TABLET | Freq: Every day | ORAL | 1 refills | Status: DC
Start: 2018-05-14 — End: 2018-11-18

## 2018-05-14 MED ORDER — ESCITALOPRAM OXALATE 10 MG PO TAB
ORAL_TABLET | Freq: Every day | 1 refills | Status: DC
Start: 2018-05-14 — End: 2018-05-14

## 2018-05-14 NOTE — Telephone Encounter
Refilled per protocol.  Darby Shadwick, LPN

## 2018-11-18 ENCOUNTER — Encounter: Admit: 2018-11-18 | Discharge: 2018-11-18 | Payer: No Typology Code available for payment source

## 2018-11-18 ENCOUNTER — Ambulatory Visit: Admit: 2018-11-18 | Discharge: 2018-11-18 | Payer: No Typology Code available for payment source

## 2018-11-18 DIAGNOSIS — F39 Unspecified mood [affective] disorder: Secondary | ICD-10-CM

## 2018-11-18 DIAGNOSIS — R5383 Other fatigue: Secondary | ICD-10-CM

## 2018-11-18 DIAGNOSIS — M7711 Lateral epicondylitis, right elbow: Secondary | ICD-10-CM

## 2018-11-18 DIAGNOSIS — Z8669 Personal history of other diseases of the nervous system and sense organs: Secondary | ICD-10-CM

## 2018-11-18 DIAGNOSIS — R232 Flushing: Secondary | ICD-10-CM

## 2018-11-18 DIAGNOSIS — B977 Papillomavirus as the cause of diseases classified elsewhere: Secondary | ICD-10-CM

## 2018-11-18 DIAGNOSIS — Z780 Asymptomatic menopausal state: Secondary | ICD-10-CM

## 2018-11-18 DIAGNOSIS — H902 Conductive hearing loss, unspecified: Secondary | ICD-10-CM

## 2018-11-18 DIAGNOSIS — M1990 Unspecified osteoarthritis, unspecified site: Secondary | ICD-10-CM

## 2018-11-18 DIAGNOSIS — Z0000 Encounter for general adult medical examination without abnormal findings: Secondary | ICD-10-CM

## 2018-11-18 DIAGNOSIS — J302 Other seasonal allergic rhinitis: Secondary | ICD-10-CM

## 2018-11-18 DIAGNOSIS — N390 Urinary tract infection, site not specified: Secondary | ICD-10-CM

## 2018-11-18 DIAGNOSIS — C3490 Malignant neoplasm of unspecified part of unspecified bronchus or lung: Secondary | ICD-10-CM

## 2018-11-18 DIAGNOSIS — G43909 Migraine, unspecified, not intractable, without status migrainosus: Secondary | ICD-10-CM

## 2018-11-18 DIAGNOSIS — F419 Anxiety disorder, unspecified: Secondary | ICD-10-CM

## 2018-11-18 DIAGNOSIS — E785 Hyperlipidemia, unspecified: Secondary | ICD-10-CM

## 2018-11-18 DIAGNOSIS — G43709 Chronic migraine without aura, not intractable, without status migrainosus: Secondary | ICD-10-CM

## 2018-11-18 LAB — CBC AND DIFF
Lab: 11 g/dL — ABNORMAL LOW (ref 12.0–15.0)
Lab: 13 % (ref 11–15)
Lab: 29 pg (ref 26–34)
Lab: 3.9 M/UL — ABNORMAL LOW (ref 4.0–5.0)
Lab: 33 g/dL (ref 32.0–36.0)
Lab: 34 % — ABNORMAL LOW (ref 36–45)
Lab: 4.7 10*3/uL — ABNORMAL HIGH (ref <100)
Lab: 88 FL — ABNORMAL HIGH (ref 80–100)

## 2018-11-18 LAB — TSH WITH FREE T4 REFLEX: Lab: 3.7 uU/mL (ref <150)

## 2018-11-18 LAB — LIPID PROFILE: Lab: 178 mg/dL (ref <200)

## 2018-11-18 LAB — COMPREHENSIVE METABOLIC PANEL: Lab: 142 MMOL/L — ABNORMAL LOW (ref >40)

## 2018-11-18 MED ORDER — TOPIRAMATE 100 MG PO TAB
100 mg | ORAL_TABLET | Freq: Two times a day (BID) | ORAL | 3 refills | Status: AC
Start: 2018-11-18 — End: ?

## 2018-11-18 MED ORDER — ESCITALOPRAM OXALATE 10 MG PO TAB
10 mg | ORAL_TABLET | Freq: Every day | ORAL | 3 refills | Status: AC
Start: 2018-11-18 — End: ?

## 2018-11-18 NOTE — Progress Notes
Health Maintenance Due   Topic Date Due    SHINGLES RECOMBINANT VACCINE (1 of 2) 10/30/2004    INFLUENZA VACCINE  09/07/2018    PHYSICAL (COMPREHENSIVE) EXAM  11/16/2018        Notes to provider:    Pt is here for medication refill her medications.     Ashley Jacobs, LPN

## 2018-11-18 NOTE — Progress Notes
Date of Service: 11/18/2018    Melinda Goodman is a 63 y.o. female. DOB: Oct 23, 1955   MRN#: [id number]    Subjective:   Chief Complaint   Patient presents with   ? Medication Refill          History of Present Illness  Melinda Goodman is 63 y.o. patient who presents to clinic for follow-up/refills.    She needs a refill on Lexapro?takes this for hot flashes, these are well controlled.  Mood is also good.  Also needs a refill on Topamax, takes this for migraines, also well controlled.  She has been feeling a little sick for the last couple weeks, some congestion and cough, very fatigued.  She was tested for COVID and this was negative.  She is feeling a little better over the last couple days.  Declines full physical today but is agreeable to annual labs.  She does report that when she got her flu shot and tetanus booster here last year she had to pay over $300.  She has lost about 30 pounds over the last year, she has been doing meal replacement shakes since June.    BP Readings from Last 5 Encounters:   11/18/18 95/61   11/15/17 121/85   03/20/17 (P) 123/72   12/04/16 117/82   09/28/16 (!) 133/104     Wt Readings from Last 5 Encounters:   11/18/18 59.9 kg (132 lb)   11/15/17 72.7 kg (160 lb 3.2 oz)   03/20/17 (P) 75.3 kg (166 lb)   12/04/16 71.8 kg (158 lb 6.4 oz)   09/28/16 70.4 kg (155 lb 3.2 oz)     Medical History:   Diagnosis Date   ? Conductive hearing loss 02/18/2016     - audiology referral   ? DJD (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   ? 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 HLD (hyperlipidemia)    ? Hot flashes 03/21/2017    11/15/2017  venlafaxine      ? 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   ? Lateral epicondylitis of right elbow 03/21/2017    started 11/2016, improved with PT 03/2017 coming back slightly  likes to golf and play tennis  PLAN encouraged patient that she may need to consider repeat PT  she will consider this and let me know if she would like to pursue PT again    ? Lung cancer (HCC) 02/24/2009     Patient was in her usual state of health until November, 2010 when she began having shoulder pain consistent with adhesive capsulitis. She had had adhesive capsulitis in the other shoulder and had to have it treated under anesthetic. She has had progressive pain which interferes with her sleep and activities. An xray was done showing a spiculated pulmonary nodule and she has undergone evaluation.   ? Menopause 11/2003    HRT   ? Migraine headache 03/19/2006    topamax; excedrin migraine prn; migraine 3-4x/ day; imitrex prn    ?  Mood disorder (HCC) 03/19/2006   ? Non-small cell lung cancer (HCC)    ? Recurrent UTI 02/18/2016    - no urinary symptoms at this time, though this has been a problem in the past - encouraged patient to call if develops symptoms   ? Seasonal allergies     allegra qday       Surgical History:   Procedure Laterality Date   ? HX PNEUMONECTOMY      RLL   ? HX TONSILLECTOMY     ? SHOULDER SURGERY         family history includes Blindness in her mother; Cancer-Breast in her maternal aunt, maternal grandmother, and paternal grandmother; Cancer-Prostate in her father; Diabetes in her maternal grandmother; GI Problem in her father; High Cholesterol in her father and mother; Hypertension in her father and mother; Macular Degen in her mother; Obesity in her brother and mother; Other in her brother.    Social History Socioeconomic History   ? Marital status: Divorced     Spouse name: Not on file   ? Number of children: Not on file   ? Years of education: Not on file   ? Highest education level: Not on file   Occupational History   ? Occupation: Runner, broadcasting/film/video, Teacher, adult education: [employer]     Comment: retired   Tobacco Use   ? Smoking status: Never Smoker   ? Smokeless tobacco: Never Used   ? Tobacco comment: Patient expieremented in college   Substance and Sexual Activity   ? Alcohol use: Yes     Alcohol/week: 2.0 standard drinks     Types: 2 Shots of liquor per week     Comment:     ? Drug use: No     Comment: HX: Marijuana   ? Sexual activity: Yes     Partners: Male     Birth control/protection: Post-menopausal   Other Topics Concern   ? Not on file   Social History Narrative   ? Not on file       Social History     Tobacco Use   ? Smoking status: Never Smoker   ? Smokeless tobacco: Never Used   ? Tobacco comment: Patient expieremented in college   Substance Use Topics   ? Alcohol use: Yes     Alcohol/week: 2.0 standard drinks     Types: 2 Shots of liquor per week     Comment:              Review of Systems   Constitution: Positive for weight loss (intentional). Negative for chills and fever.   Cardiovascular: Negative for chest pain and dyspnea on exertion.   Respiratory: Positive for cough. Negative for shortness of breath.         Chest congestion    Neurological: Negative for dizziness and headaches.   All other systems reviewed and are negative.      Objective:     ? escitalopram oxalate (LEXAPRO) 10 mg tablet Take one tablet by mouth daily.   ? fexofenadine(+) (ALLEGRA) 180 mg PO tablet Take 180 mg by mouth Every Morning.   ? ibuprofen/diphenhydramine cit (ADVIL PM PO) Take  by mouth.   ? topiramate (TOPAMAX) 100 mg tablet Take one tablet by mouth twice daily.     Vitals:    11/18/18 0838   BP: 95/61   Pulse: 75   Resp: 18   Temp: 36.4 ?C (97.6 ?F)  TempSrc: Oral   SpO2: 98% Weight: 59.9 kg (132 lb)   Height: 170.2 cm (67)   PainSc: Zero     Body mass index is 20.67 kg/m?Marland Kitchen     Physical Exam  Vitals signs and nursing note reviewed.   Constitutional:       General: She is not in acute distress.     Appearance: Normal appearance. She is well-developed.   HENT:      Head: Normocephalic and atraumatic.      Right Ear: Hearing, tympanic membrane, ear canal and external ear normal.      Left Ear: Hearing, tympanic membrane, ear canal and external ear normal.      Nose: Nose normal. No mucosal edema or rhinorrhea.      Right Sinus: No maxillary sinus tenderness or frontal sinus tenderness.      Left Sinus: No maxillary sinus tenderness or frontal sinus tenderness.      Mouth/Throat:      Lips: Pink.      Mouth: Mucous membranes are moist.      Pharynx: Oropharynx is clear.   Neck:      Musculoskeletal: Normal range of motion.   Cardiovascular:      Rate and Rhythm: Normal rate and regular rhythm.      Heart sounds: Normal heart sounds.   Pulmonary:      Effort: Pulmonary effort is normal. No respiratory distress.      Breath sounds: Normal breath sounds.   Musculoskeletal: Normal range of motion.   Skin:     General: Skin is warm and dry.   Neurological:      Mental Status: She is alert and oriented to person, place, and time.   Psychiatric:         Mood and Affect: Mood normal.         Behavior: Behavior normal.         Greater than 25 minutes spent with pt with >50% of the time counseling and coordinating care.  This consultation time included discussion of medical issues, interpretation of data and educating patient as to condition and treatment options (and potential risks and benefits).         Assessment and Plan:    Hot flashes  ?Refill Lexapro    Chronic migraine without aura without status migrainosus, not intractable  -Refilled ? topiramate (TOPAMAX) 100 mg tablet; Take one tablet by mouth twice daily.    Preventative health care -     CBC AND DIFF; Future; Expected date: 11/18/2018  -     COMPREHENSIVE METABOLIC PANEL; Future; Expected date: 11/18/2018  -     TSH WITH FREE T4 REFLEX; Future; Expected date: 11/18/2018  -     LIPID PROFILE; Future; Expected date: 11/18/2018  -     25-OH VITAMIN D (D2 + D3); Future; Expected date: 11/18/2018    Fatigue, unspecified type  -     CBC AND DIFF; Future; Expected date: 11/18/2018  -     COMPREHENSIVE METABOLIC PANEL; Future; Expected date: 11/18/2018  -     TSH WITH FREE T4 REFLEX; Future; Expected date: 11/18/2018  -     LIPID PROFILE; Future; Expected date: 11/18/2018  -     25-OH VITAMIN D (D2 + D3); Future; Expected date: 11/18/2018    Mood disorder (HCC)    Other orders  -     escitalopram oxalate (LEXAPRO) 10 mg tablet; Take one tablet by mouth daily.    Orders Placed  This Encounter   ? CBC AND DIFF   ? COMPREHENSIVE METABOLIC PANEL   ? TSH WITH FREE T4 REFLEX   ? LIPID PROFILE   ? 25-OH VITAMIN D (D2 + D3)   ? escitalopram oxalate (LEXAPRO) 10 mg tablet   ? topiramate (TOPAMAX) 100 mg tablet     No future appointments.  There are no Patient Instructions on file for this visit.    Janine Ores, APRN-NP

## 2018-11-19 ENCOUNTER — Encounter: Admit: 2018-11-19 | Discharge: 2018-11-19 | Payer: No Typology Code available for payment source

## 2018-11-19 DIAGNOSIS — D649 Anemia, unspecified: Secondary | ICD-10-CM

## 2018-11-19 LAB — 25-OH VITAMIN D (D2 + D3): Lab: 43 ng/mL (ref 30–80)

## 2018-11-21 ENCOUNTER — Encounter: Admit: 2018-11-21 | Discharge: 2018-11-21 | Payer: No Typology Code available for payment source

## 2018-11-21 DIAGNOSIS — D649 Anemia, unspecified: Secondary | ICD-10-CM

## 2019-03-03 ENCOUNTER — Encounter: Admit: 2019-03-03 | Discharge: 2019-03-03 | Payer: No Typology Code available for payment source

## 2019-04-10 ENCOUNTER — Encounter: Admit: 2019-04-10 | Discharge: 2019-04-10 | Payer: No Typology Code available for payment source

## 2019-10-20 ENCOUNTER — Encounter: Admit: 2019-10-20 | Discharge: 2019-10-20 | Payer: No Typology Code available for payment source

## 2019-11-20 ENCOUNTER — Encounter: Admit: 2019-11-20 | Discharge: 2019-11-20 | Payer: Medicare Other

## 2019-11-20 ENCOUNTER — Encounter: Admit: 2019-11-20 | Discharge: 2019-11-20 | Payer: MEDICARE

## 2019-11-20 ENCOUNTER — Ambulatory Visit: Admit: 2019-11-20 | Discharge: 2019-11-20 | Payer: MEDICARE

## 2019-11-20 DIAGNOSIS — Z1231 Encounter for screening mammogram for malignant neoplasm of breast: Secondary | ICD-10-CM

## 2019-11-25 ENCOUNTER — Encounter: Admit: 2019-11-25 | Discharge: 2019-11-25 | Payer: MEDICARE

## 2019-11-25 DIAGNOSIS — F39 Unspecified mood [affective] disorder: Secondary | ICD-10-CM

## 2019-11-25 DIAGNOSIS — N390 Urinary tract infection, site not specified: Secondary | ICD-10-CM

## 2019-11-25 DIAGNOSIS — C3431 Malignant neoplasm of lower lobe, right bronchus or lung: Secondary | ICD-10-CM

## 2019-11-25 DIAGNOSIS — C3490 Malignant neoplasm of unspecified part of unspecified bronchus or lung: Secondary | ICD-10-CM

## 2019-11-25 DIAGNOSIS — Z13820 Encounter for screening for osteoporosis: Secondary | ICD-10-CM

## 2019-11-25 DIAGNOSIS — M1990 Unspecified osteoarthritis, unspecified site: Secondary | ICD-10-CM

## 2019-11-25 DIAGNOSIS — B977 Papillomavirus as the cause of diseases classified elsewhere: Secondary | ICD-10-CM

## 2019-11-25 DIAGNOSIS — R232 Flushing: Secondary | ICD-10-CM

## 2019-11-25 DIAGNOSIS — E785 Hyperlipidemia, unspecified: Secondary | ICD-10-CM

## 2019-11-25 DIAGNOSIS — Z9189 Other specified personal risk factors, not elsewhere classified: Secondary | ICD-10-CM

## 2019-11-25 DIAGNOSIS — Z1331 Encounter for screening for depression: Secondary | ICD-10-CM

## 2019-11-25 DIAGNOSIS — Z0000 Encounter for general adult medical examination without abnormal findings: Secondary | ICD-10-CM

## 2019-11-25 DIAGNOSIS — Z8669 Personal history of other diseases of the nervous system and sense organs: Secondary | ICD-10-CM

## 2019-11-25 DIAGNOSIS — H902 Conductive hearing loss, unspecified: Secondary | ICD-10-CM

## 2019-11-25 DIAGNOSIS — G43109 Migraine with aura, not intractable, without status migrainosus: Secondary | ICD-10-CM

## 2019-11-25 DIAGNOSIS — M7711 Lateral epicondylitis, right elbow: Secondary | ICD-10-CM

## 2019-11-25 DIAGNOSIS — F419 Anxiety disorder, unspecified: Secondary | ICD-10-CM

## 2019-11-25 DIAGNOSIS — G43909 Migraine, unspecified, not intractable, without status migrainosus: Secondary | ICD-10-CM

## 2019-11-25 DIAGNOSIS — Z780 Asymptomatic menopausal state: Secondary | ICD-10-CM

## 2019-11-25 DIAGNOSIS — J302 Other seasonal allergic rhinitis: Secondary | ICD-10-CM

## 2019-11-25 DIAGNOSIS — Z23 Encounter for immunization: Secondary | ICD-10-CM

## 2019-11-25 MED ORDER — ESCITALOPRAM OXALATE 20 MG PO TAB
20 mg | ORAL_TABLET | Freq: Every day | ORAL | 3 refills | Status: AC
Start: 2019-11-25 — End: ?

## 2019-11-25 MED ORDER — TOPIRAMATE 100 MG PO TAB
100 mg | ORAL_TABLET | Freq: Two times a day (BID) | ORAL | 3 refills | Status: AC
Start: 2019-11-25 — End: ?

## 2019-11-25 MED ORDER — SUMATRIPTAN SUCCINATE 25 MG PO TAB
ORAL_TABLET | SUBCUTANEOUS | 3 refills | 30.00000 days | Status: AC
Start: 2019-11-25 — End: ?

## 2019-11-26 ENCOUNTER — Ambulatory Visit: Admit: 2019-11-25 | Discharge: 2019-11-26 | Payer: MEDICARE

## 2019-11-26 DIAGNOSIS — Z780 Asymptomatic menopausal state: Secondary | ICD-10-CM

## 2019-11-26 DIAGNOSIS — D649 Anemia, unspecified: Principal | ICD-10-CM

## 2019-11-26 DIAGNOSIS — G43709 Chronic migraine without aura, not intractable, without status migrainosus: Secondary | ICD-10-CM

## 2019-11-26 LAB — COMPREHENSIVE METABOLIC PANEL
Lab: 142 MMOL/L — ABNORMAL HIGH (ref <150)
Lab: 19 U/L (ref 7–56)
Lab: 20 U/L (ref 7–40)
Lab: 24 MMOL/L (ref 21–30)
Lab: 40 mL/min — ABNORMAL LOW (ref >60)
Lab: 49 mL/min — ABNORMAL LOW (ref >60)
Lab: 9 (ref 3–12)

## 2019-11-26 LAB — IRON + BINDING CAPACITY + %SAT+ FERRITIN: Lab: 97 ug/dL — ABNORMAL HIGH (ref 50–160)

## 2019-11-26 LAB — CBC
Lab: 12 % (ref 11–15)
Lab: 12 g/dL (ref 12.0–15.0)
Lab: 200 10*3/uL (ref 150–400)
Lab: 30 pg (ref 26–34)
Lab: 34 g/dL (ref 32.0–36.0)
Lab: 37 % (ref 36–45)
Lab: 4.1 M/UL — ABNORMAL HIGH (ref <100)
Lab: 4.5 10*3/uL (ref >40)
Lab: 7.3 FL (ref 7–11)
Lab: 90 FL — ABNORMAL HIGH (ref 80–100)

## 2019-11-26 LAB — LIPID PROFILE: Lab: 257 mg/dL — ABNORMAL HIGH (ref <200)

## 2019-11-28 ENCOUNTER — Encounter: Admit: 2019-11-28 | Discharge: 2019-11-28 | Payer: MEDICARE

## 2020-05-28 ENCOUNTER — Encounter: Admit: 2020-05-28 | Discharge: 2020-05-28 | Payer: MEDICARE

## 2020-05-28 MED ORDER — ATORVASTATIN 10 MG PO TAB
ORAL_TABLET | Freq: Every evening | 1 refills
Start: 2020-05-28 — End: ?

## 2020-06-01 ENCOUNTER — Encounter: Admit: 2020-06-01 | Discharge: 2020-06-01 | Payer: MEDICARE

## 2020-10-28 ENCOUNTER — Encounter: Admit: 2020-10-28 | Discharge: 2020-10-28 | Payer: MEDICARE

## 2020-12-17 ENCOUNTER — Encounter: Admit: 2020-12-17 | Discharge: 2020-12-17 | Payer: MEDICARE

## 2020-12-20 ENCOUNTER — Encounter: Admit: 2020-12-20 | Discharge: 2020-12-20 | Payer: MEDICARE

## 2020-12-27 ENCOUNTER — Encounter: Admit: 2020-12-27 | Discharge: 2020-12-27 | Payer: MEDICARE

## 2020-12-27 ENCOUNTER — Ambulatory Visit: Admit: 2020-12-27 | Discharge: 2020-12-27 | Payer: MEDICARE

## 2020-12-27 DIAGNOSIS — Z1231 Encounter for screening mammogram for malignant neoplasm of breast: Secondary | ICD-10-CM

## 2020-12-28 ENCOUNTER — Encounter: Admit: 2020-12-28 | Discharge: 2020-12-28 | Payer: MEDICARE

## 2020-12-28 ENCOUNTER — Ambulatory Visit: Admit: 2020-12-28 | Discharge: 2020-12-28 | Payer: MEDICARE

## 2020-12-28 DIAGNOSIS — F39 Unspecified mood [affective] disorder: Secondary | ICD-10-CM

## 2020-12-28 DIAGNOSIS — Z1211 Encounter for screening for malignant neoplasm of colon: Secondary | ICD-10-CM

## 2020-12-28 DIAGNOSIS — D649 Anemia, unspecified: Secondary | ICD-10-CM

## 2020-12-28 DIAGNOSIS — G43709 Chronic migraine without aura, not intractable, without status migrainosus: Secondary | ICD-10-CM

## 2020-12-28 DIAGNOSIS — Z780 Asymptomatic menopausal state: Secondary | ICD-10-CM

## 2020-12-28 DIAGNOSIS — F419 Anxiety disorder, unspecified: Secondary | ICD-10-CM

## 2020-12-28 DIAGNOSIS — B977 Papillomavirus as the cause of diseases classified elsewhere: Secondary | ICD-10-CM

## 2020-12-28 DIAGNOSIS — C3490 Malignant neoplasm of unspecified part of unspecified bronchus or lung: Secondary | ICD-10-CM

## 2020-12-28 DIAGNOSIS — J302 Other seasonal allergic rhinitis: Secondary | ICD-10-CM

## 2020-12-28 DIAGNOSIS — M1990 Unspecified osteoarthritis, unspecified site: Secondary | ICD-10-CM

## 2020-12-28 DIAGNOSIS — H902 Conductive hearing loss, unspecified: Secondary | ICD-10-CM

## 2020-12-28 DIAGNOSIS — Z01419 Encounter for gynecological examination (general) (routine) without abnormal findings: Secondary | ICD-10-CM

## 2020-12-28 DIAGNOSIS — S62647D Nondisplaced fracture of proximal phalanx of left little finger, subsequent encounter for fracture with routine healing: Secondary | ICD-10-CM

## 2020-12-28 DIAGNOSIS — R232 Flushing: Secondary | ICD-10-CM

## 2020-12-28 DIAGNOSIS — M79645 Pain in left finger(s): Secondary | ICD-10-CM

## 2020-12-28 DIAGNOSIS — M7711 Lateral epicondylitis, right elbow: Secondary | ICD-10-CM

## 2020-12-28 DIAGNOSIS — E785 Hyperlipidemia, unspecified: Secondary | ICD-10-CM

## 2020-12-28 DIAGNOSIS — G43909 Migraine, unspecified, not intractable, without status migrainosus: Secondary | ICD-10-CM

## 2020-12-28 DIAGNOSIS — C3431 Malignant neoplasm of lower lobe, right bronchus or lung: Secondary | ICD-10-CM

## 2020-12-28 DIAGNOSIS — N390 Urinary tract infection, site not specified: Secondary | ICD-10-CM

## 2020-12-28 DIAGNOSIS — Z8669 Personal history of other diseases of the nervous system and sense organs: Secondary | ICD-10-CM

## 2020-12-28 DIAGNOSIS — Z13820 Encounter for screening for osteoporosis: Secondary | ICD-10-CM

## 2020-12-28 DIAGNOSIS — M8580 Other specified disorders of bone density and structure, unspecified site: Secondary | ICD-10-CM

## 2020-12-28 LAB — CBC
HEMATOCRIT: 37 % (ref 36–45)
HEMOGLOBIN: 12 g/dL (ref 12.0–15.0)
MCH: 30 pg (ref 26–34)
MCV: 90 FL (ref 80–100)
MPV: 7 FL (ref 7–11)
PLATELET COUNT: 228 K/UL (ref 150–400)
RDW: 13 % (ref 11–15)
WBC COUNT: 5 K/UL (ref >40)

## 2020-12-28 LAB — COMPREHENSIVE METABOLIC PANEL
ALK PHOSPHATASE: 74 U/L (ref 25–110)
ANION GAP: 8 (ref 3–12)
EGFR: 55 mL/min — ABNORMAL LOW (ref >60)
POTASSIUM: 4 MMOL/L (ref 3.5–5.1)
SODIUM: 141 MMOL/L (ref 137–147)

## 2020-12-28 LAB — LIPID PROFILE
CHOLESTEROL: 274 mg/dL — ABNORMAL HIGH (ref <200)
TRIGLYCERIDES: 137 mg/dL (ref <150)

## 2020-12-28 LAB — TSH WITH FREE T4 REFLEX: TSH: 4 uU/mL (ref 0.35–5.00)

## 2020-12-28 MED ORDER — ATORVASTATIN 10 MG PO TAB
10 mg | ORAL_TABLET | Freq: Every evening | ORAL | 3 refills | Status: DC
Start: 2020-12-28 — End: 2020-12-28

## 2020-12-28 MED ORDER — SHINGRIX (PF) 50 MCG/0.5 ML IM SUSR
.5 mL | Freq: Once | INTRAMUSCULAR | 1 refills | Status: AC
Start: 2020-12-28 — End: ?

## 2020-12-28 MED ORDER — ATORVASTATIN 40 MG PO TAB
40 mg | ORAL_TABLET | Freq: Every evening | ORAL | 3 refills | Status: AC
Start: 2020-12-28 — End: ?

## 2020-12-28 MED ORDER — ESCITALOPRAM OXALATE 20 MG PO TAB
20 mg | ORAL_TABLET | Freq: Every day | ORAL | 3 refills | Status: AC
Start: 2020-12-28 — End: ?

## 2020-12-28 MED ORDER — SUMATRIPTAN SUCCINATE 25 MG PO TAB
ORAL_TABLET | SUBCUTANEOUS | 3 refills | 30.00000 days | Status: AC
Start: 2020-12-28 — End: ?

## 2020-12-28 MED ORDER — TOPIRAMATE 100 MG PO TAB
100 mg | ORAL_TABLET | Freq: Two times a day (BID) | ORAL | 3 refills | Status: AC
Start: 2020-12-28 — End: ?

## 2020-12-28 NOTE — Telephone Encounter
Response sent in MyChart message that patient previously sent message through.

## 2020-12-28 NOTE — Telephone Encounter
Please let her know finger fracture and ortho referral and do not use the finger.  IMPRESSION  Findings/impression:  1. An oblique fracture is present involving the proximal phalanx of the fifth digit. There is no evidence of significant displacement of fracture fragments. There is no evidence of significant angulation at fracture site. Early callus formation is noted at the fracture site consistent with a subacute chronicity.  2. Prominent degenerative changes are present involving the fifth DIP joint. Mild degenerative changes are present involving the triscaphe and first CMC joints.

## 2020-12-28 NOTE — Progress Notes
Estimated body mass index is 24.9 kg/m? as calculated from the following:    Height as of this encounter: 170.2 cm (5' 7).    Weight as of this encounter: 72.1 kg (159 lb).   Patient Care Team:  Werner Lean, MD as PCP - General (Internal Medicine)  Gerri Lins, MD  Raleigh Callas, MD (Oncology)  Luis Abed, PA-C (Inactive) (Cardiovascular Disease)  Zella Richer, MD (Thoracic Surgery)  Werner Lean, MD as CCP - Continuity of Care Provider (Internal Medicine)    Date of Service: 12/28/2020    Melinda Goodman is a 65 y.o. female.  DOB: [birth year]  MRN: [id number]     SUBJECTIVE       She presents today for an Annual Medicare Wellness visit.   Chief Complaint   Patient presents with   ? Annual Wellness Visit   ? Pain     Left pinky finger swollen/painful after was pulled from walking dog; difficult closing palm and golfing     Patient answers are not available for this visit.      Problem List Review     I have reviewed and updated the problem list below and addressed acute and chronic conditions with patient or recommended a follow up plan.   Patient Active Problem List    Diagnosis   ? Anemia   ? HLD (hyperlipidemia)   ? Hot flashes   ? Lateral epicondylitis of right elbow   ? History of glaucoma   ? Hormone replacement therapy (postmenopausal)   ? Insomnia secondary to anxiety   ? Recurrent UTI   ? Conductive hearing loss   ? Well woman exam   ? Lung cancer (HCC)   ? Mood disorder (HCC)   ? Allergic rhinitis   ? DJD (degenerative joint disease)   ? Human papilloma virus infection   ? Migraine headache          Risk Review   Lab Draw:  Lab Results   Component Value Date/Time    HGBA1C 4.9 03/12/2009 02:16 PM     POC:  No results found for: A1C    Lab Results   Component Value Date    CHOL 257 11/25/2019     The 10-year ASCVD risk score (Arnett DK, et al., 2019) is: 7.7%    Values used to calculate the score:      Age: 33 years      Sex: Female      Is Non-Hispanic African American: No Diabetic: No      Tobacco smoker: No      Systolic Blood Pressure: 133 mmHg      Is BP treated: No      HDL Cholesterol: 52 MG/DL      Total Cholesterol: 257 MG/DL  Preventive Medications   Statin discussion: I have reviewed the patient's ASCVD risk and patient: restarting    Aspirin discussion:  Patient has history of cardiovascular disease and after risk-benefit discussion: patient has contraindication to aspirin or risk outweighs benefit (h/o GI bleed or ulcers, other bleeding, age >70 years, thrombocytopenia, coagulopathy, CKD, on NSAIDS, steroids, anticoag).          Preventive Screening   I reviewed the health maintenance tab with the patient and verified all proof of screenings are present in the chart and ordered outside records as appropriate.    Health Maintenance   Topic Date Due   ? SHINGLES RECOMBINANT VACCINE (1 of 2) 10/30/2004   ?  OSTEOPOROSIS SCREENING/MONITORING  Never done   ? COVID-19 VACCINE (4 - Booster for Pfizer series) 01/16/2020   ? PNEUMOCOCCAL VACCINE (2 - PCV) 11/24/2020   ? COLORECTAL CANCER SCREENING  12/13/2020   ? BREAST CANCER SCREENING  12/27/2021   ? PHYSICAL (COMPREHENSIVE) EXAM  12/28/2021   ? MEDICARE ANNUAL WELLNESS VISIT  12/28/2021   ? DTAP/TDAP VACCINES (3 - Td or Tdap) 11/16/2027   ? INFLUENZA VACCINE  Completed   ? DEPRESSION SCREENING  Completed   ? ADVANCED CARE PLANNING DISCUSSION AND DOCUMENTATION  Completed   ? HEPATITIS C SCREENING  Addressed       Immunizations     Immunization History   Administered Date(s) Administered   ? COVID-19 (PFIZER), mRNA vacc, 30 mcg/0.3 mL (PF) 04/16/2019, 05/07/2019, 11/21/2019   ? Flu Vaccine =>6 Months Quadrivalent PF 12/04/2016, 11/15/2017   ? Flu Vaccine =>65 YO High-Dose Quadrivalent (PF) 11/21/2019   ? Flu Vaccine Cell Cult Quad 4+YO (PF)(Flucelvax) 11/25/2018   ? Flu vaccine, inj unspecified (Historical) 11/12/2018, 12/23/2020   ? Pneumococcal Vaccine (23-Val Adult) 11/25/2019   ? Td vaccine (>=7YO) PF IM (Decavac, Tenivac) 11/15/2017   ? Tdap Vaccine 02/07/2004   ? Zoster Vaccine, Unspecified (Historical) 09/27/2015        Health Risk Assessment Questionnaire     The patient completed a health risk assessment with results reviewed and addressed with patient.    Health Risk Assessment Questionnaire  Current Care  List of Providers you have seen in the last two years: A provider at the OP location. She is a Freight forwarder.  Are you receiving home health?: No  During the past 4 weeks, how would you rate your health in general?: Good    Outside Care  Since your last PCP visit, have you received care outside of [facility]?: No    Physical Activity  Do you exercise or are you physically active?: Yes  How many days a week do you usually exercise or are physically active?: (P) 3  On days when you exercised or were physically active, how many minutes was the activity?: (P) 90  During the past four weeks, what was the hardest physical activity you could do for at least two minutes?: (P) Moderate    Diet  In the past month, were you worried whether your food would run out before you or your family had money to buy more?: No  In the past 7 days, how many times did you eat fast food or junk food or pizza?: (!) 2, encourage to cut back  In the past 7 days, how many servings of fruits or vegetables did you eat each day?: (!) 2-3, encourage to increase  In the past 7 days, how many sodas and sugar sweetened drinks (regular, not diet) did you drink each day?: 1    Smoke/Tobacco Use  Are you currently a smoker?: No    Alcohol Use  Do you drink alcohol?: Yes  Are you Female or Female?: (P) Female  Female: In the last three months, have you had >3 alcoholic beverages in any one day or >7 in any one week?: (P) No    Depression Screen  Little interest or pleasure in doing things: Not at All  Feeling down, depressed or hopeless: (!) Several Days, cont lexapro, denies counseling.    Pain  How would you rate your pain today?: (!) Mild pain, finger injury.   OT. xray    Ambulation  Do you use any assistive devices for ambulation?: No    Fall Risk  Does it take you longer than 30 seconds to get up and out of a chair?: No  Have you fallen in the past year?: (P) No    Motor Vehicle Safety  Do you fasten your seat belt when you are in the car?: Yes    Sun Exposure  Do you protect yourself from the sun? For example, wear sunscreen when outside.: Yes    Hearing Loss  Do you have trouble hearing the television or radio when others do not?: No  Do you have to strain or struggle to hear/understand conversation?: No  Do you use hearing aids?: No    Cognitive Impairment  During the past 12 months, have you experienced confusion or memory loss that is happening more often or is getting worse?: (!) Yes  , reports normal for aging.     Functional Screen  Do you live alone?: Yes  Do you live at: Home  Can you drive your own car or travel alone by bus or taxi?: Yes  Can you shop for groceries or clothes without help?: Yes  Can you prepare your own meals?: Yes  Can you do your own housework without help?: Yes  Can you handle your own money without help?: Yes  Do you need help eating, bathing, dressing, or getting around your home?: No  Do you feel safe?: Yes  Does anyone at home hurt you, hit you, or threaten you?: No  Have you ever been the victim of abuse?: No    Home Safety  Does your home have grab bars in the bathroom?: (!) No, had them removed.   Does your home have hand rails on stairs and steps?: Yes  Does your home have functioning smoke alarms?: Yes    Advance Directive  Do you have a living will or Advance Directive?: Yes, bring a copy for scanning.     Dental Screen  Have you had an exam by your dentist in the last year?: Yes    Vision Screen  Do you have diabetes?: No    Urinary Incontinence  Have you had urine leakage in the past 6 months?: No  Does your urine leakage negatively impact your daily activities or sleep?: (not recorded)    Social Determinants of Health     Social Determinants of Health with Concerns     Stress: Not on file       History Review     Past Medical History:   Diagnosis Date   ? Conductive hearing loss 02/18/2016     - audiology referral   ? DJD (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   ? 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 HLD (hyperlipidemia)    ? Hot flashes 03/21/2017    11/15/2017  venlafaxine      ? 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   ? Lateral epicondylitis of right elbow  03/21/2017    started 11/2016, improved with PT 03/2017 coming back slightly  likes to golf and play tennis  PLAN encouraged patient that she may need to consider repeat PT  she will consider this and let me know if she would like to pursue PT again    ? Lung cancer (HCC) 02/24/2009     Patient was in her usual state of health until November, 2010 when she began having shoulder pain consistent with adhesive capsulitis. She had had adhesive capsulitis in the other shoulder and had to have it treated under anesthetic. She has had progressive pain which interferes with her sleep and activities. An xray was done showing a spiculated pulmonary nodule and she has undergone evaluation.   ? Menopause 11/2003    HRT   ? Migraine headache 03/19/2006    topamax; excedrin migraine prn; migraine 3-4x/ day; imitrex prn    ? Mood disorder (HCC) 03/19/2006   ? Non-small cell lung cancer (HCC)    ? Recurrent UTI 02/18/2016    - no urinary symptoms at this time, though this has been a problem in the past - encouraged patient to call if develops symptoms   ? Seasonal allergies     allegra qday     Past Surgical History:   Procedure Laterality Date   ? HX PNEUMONECTOMY      RLL   ? HX TONSILLECTOMY     ? SHOULDER SURGERY        reports that she has never smoked. She has never used smokeless tobacco. She reports current alcohol use of about 2.0 standard drinks per week. She reports being sexually active and has had partner(s) who are female. She reports using the following method of birth control/protection: Post-menopausal. She reports that she does not use drugs.  Family History   Problem Relation Age of Onset   ? Cancer-Prostate Father    ? Hypertension Father    ? High Cholesterol Father    ? GI Problem Father         ulcers   ? Cancer-Breast Maternal Aunt    ? Cancer-Breast Maternal Grandmother    ? Diabetes Maternal Grandmother    ? Cancer-Breast Paternal Grandmother    ? Blindness Mother    ? Macular Degen Mother    ? Obesity Mother    ? Hypertension Mother    ? High Cholesterol Mother    ? Obesity Brother    ? Other Brother         GERD                 No Known Allergies  Review of Systems         Review of Systems    OBJECTIVE     Vitals:    12/28/20 0850   BP: 133/84   BP Source: Arm, Right Upper   Pulse: 84   Resp: 16   SpO2: 99%   PainSc: Zero   Weight: 72.1 kg (159 lb)   Height: 170.2 cm (5' 7)     Body mass index is 24.9 kg/m?Marland Kitchen   Physical Exam    Mini-Cog  Steps:   1. Tell patient 3 words.   2. Patient draws clock at 11:10: Normal clock, 2 points  3. Patient recalls words: Recalled 3 words, 3 points  Score and follow up: 3-5 points - lower risk of dementia, referral on case-by-case basis    Get-up-and-go Test  Less than 12 seconds, normal  Medications   I have completed a medication reconciliation, discussed medication adherence, and reviewed the list for high-risk medications (BEERS) and results are below:     ? atorvastatin (LIPITOR) 10 mg tablet Take one tablet by mouth at bedtime daily.   ? escitalopram oxalate (LEXAPRO) 20 mg tablet Take one tablet by mouth daily.   ? fexofenadine(+) (ALLEGRA) 180 mg PO tablet Take 180 mg by mouth Every Morning.   ? ibuprofen/diphenhydramine cit (ADVIL PM PO) Take  by mouth.   ? SUMAtriptan succinate (IMITREX) 25 mg tablet Take one tablet by mouth at onset of headache. May repeat after 2 hours if needed. Max of 200 mg in 24 hours.   ? topiramate (TOPAMAX) 100 mg tablet Take one tablet by mouth twice daily.   ? varicella-zoster gE-AS01B (PF) (SHINGRIX (PF)) 50 mcg/0.5 mL injection Inject 0.5 mL into the muscle once for 1 dose. IM: 0.5 mL administered as a 2-dose series at 0 and 2 to 6 months  Fax verification of vaccination to [phone]          ASSESSMENT AND PLAN       Personal prevention plan reviewed with patient and is accessible via patient's After Visit Summary and visit note.    Melinda Goodman was seen today for annual wellness visit and pain.    Diagnoses and all orders for this visit:    Osteoporosis screening    Hyperlipidemia, unspecified hyperlipidemia type  -     LIPID PROFILE; Future; Expected date: 12/27/2021  -     TSH WITH FREE T4 REFLEX; Future; Expected date: 12/27/2021  -     COMPREHENSIVE METABOLIC PANEL; Future; Expected date: 12/28/2020  -     LIPID PROFILE; Future; Expected date: 12/28/2020  -     CBC; Future; Expected date: 12/28/2020  -     TSH WITH FREE T4 REFLEX; Future; Expected date: 12/28/2020    Malignant neoplasm of lower lobe of right lung (HCC)    Asymptomatic menopausal state    Colon cancer screening  -     AMB REFERRAL TO GI LAB FOR PROCEDURE    Well woman exam  -     COMPREHENSIVE METABOLIC PANEL; Future; Expected date: 12/27/2021  -     LIPID PROFILE; Future; Expected date: 12/27/2021  -     CBC; Future; Expected date: 12/27/2021  -     TSH WITH FREE T4 REFLEX; Future; Expected date: 12/27/2021  -     COMPREHENSIVE METABOLIC PANEL; Future; Expected date: 12/28/2020  -     LIPID PROFILE; Future; Expected date: 12/28/2020  -     CBC; Future; Expected date: 12/28/2020  -     TSH WITH FREE T4 REFLEX; Future; Expected date: 12/28/2020    Chronic migraine without 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    Anemia, unspecified type    Mood disorder (HCC)    Other orders  -     varicella-zoster gE-AS01B (PF) (SHINGRIX (PF)) 50 mcg/0.5 mL injection; Inject 0.5 mL into the muscle once for 1 dose. IM: 0.5 mL administered as a 2-dose series at 0 and 2 to 6 months  Fax verification of vaccination to [phone]  -     COVID-19 BIVALENT BOOSTER (28YR+)(PFIZER) VAC 30MCG/0.3ML  -     PNEUMOCOCCAL VACCINE 20-VAL  -     atorvastatin (LIPITOR) 10 mg tablet; Take one tablet  by mouth at bedtime daily.  -     escitalopram oxalate (LEXAPRO) 20 mg tablet; Take one tablet by mouth daily.  -     SUMAtriptan succinate (IMITREX) 25 mg tablet; Take one tablet by mouth at onset of headache. May repeat after 2 hours if needed. Max of 200 mg in 24 hours.      Encounter Medications   Medications   ? varicella-zoster gE-AS01B (PF) (SHINGRIX (PF)) 50 mcg/0.5 mL injection     Sig: Inject 0.5 mL into the muscle once for 1 dose. IM: 0.5 mL administered as a 2-dose series at 0 and 2 to 6 months  Fax verification of vaccination to [phone]     Dispense:  1 each     Refill:  1   ? atorvastatin (LIPITOR) 10 mg tablet     Sig: Take one tablet by mouth at bedtime daily.     Dispense:  90 tablet     Refill:  3   ? escitalopram oxalate (LEXAPRO) 20 mg tablet     Sig: Take one tablet by mouth daily.     Dispense:  90 tablet     Refill:  3   ? SUMAtriptan succinate (IMITREX) 25 mg tablet     Sig: Take one tablet by mouth at onset of headache. May repeat after 2 hours if needed. Max of 200 mg in 24 hours.     Dispense:  9 tablet     Refill:  3   ? topiramate (TOPAMAX) 100 mg tablet     Sig: Take one tablet by mouth twice daily.     Dispense:  180 tablet     Refill:  3 Medications Discontinued During This Encounter   Medication Reason   ? escitalopram oxalate (LEXAPRO) 20 mg tablet Reorder   ? topiramate (TOPAMAX) 100 mg tablet Reorder   ? SUMAtriptan succinate (IMITREX) 25 mg tablet Reorder   ? atorvastatin (LIPITOR) 10 mg tablet Reorder     Patient Instructions     Thank you for coming in today!  Our plan from this visit:  The lifestyle change that you have decided on today is:   Exercise.  Hydrate with at least 64 ounces of water daily unless you are on a fluid restriction.   Follow low sodium diet, 2000 mg/day or less If you are having swelling.              Refills done 12/27/2020    Fasting labs in 12  months prior to appt.      Please call to schedule your colonoscopy [phone].        Effective February 06, 2021 the shingles vaccine, Shingrix, is available for free to all Medicare beneficiaries.  The Shingrix vaccine is covered under your Medicare Part D prescription drug plan.   As of 2023 all Part D drug coverage must offer the Shingrix vaccine free of charge. It does not matter if you have a Original Medicare and a stand-alone Part D plan or a Part D plan that is bundled with your Medicare Advantage Plan. All are now required to offer the vaccine at no cost to the consumer. You can get the shingles vaccine with no out of pocket cost. Please take the printed prescription to an outside (not [facility]) pharmacy,  where the pharmacists do vaccines.    Shingrix vaccine is 2 shots, 2 months apart- shingrix.com for more info.     Please call to schedule your colonoscopy [phone].  Bone  density test ordered to look for osteoporosis or osteopenia.       We practice evidence based medicine, this means that we use the current best scientific evidence in making decisions about the care of individual patients.   Please read about medical issues including covid and influenza vaccines only in peer reviewed medical journals- I recommend [place] Journal of Medicine and Annals of Internal Medicine, both are available online for free.    Many patients do their labs a few days before their appointment with me and we will plan to discuss the results at that appointment.  But, as a part of the CARES act, starting 05/08/2019, some results will be released to mychart automatically.  I will continue to send a note on any labs that I order.  But, with these changes you may see your results before I do.  Critical lab results will be addressed immediately, but otherwise please  give me 72 hours to view and respond to your results before reaching out with questions.       We are in clinic on Mondays AM, Tuesdays AM,  Wednesday all day, Thursdays PM and Fridays AM.  We are doing telehealth appointments on some Wednesdays.      If you have an urgent issue that you believe can be taken care of in our clinic by being overbooked on a Monday, Tuesday or Friday am, then Please call the nurse, Tresa Endo, at [phone] to be added to our schedule.     Melinda Goodman was seen today for annual wellness visit and pain.    Diagnoses and all orders for this visit:    Osteoporosis screening    Hyperlipidemia, unspecified hyperlipidemia type  -     LIPID PROFILE; Future; Expected date: 12/27/2021  -     TSH WITH FREE T4 REFLEX; Future; Expected date: 12/27/2021  -     COMPREHENSIVE METABOLIC PANEL; Future; Expected date: 12/28/2020  -     LIPID PROFILE; Future; Expected date: 12/28/2020  -     CBC; Future; Expected date: 12/28/2020  -     TSH WITH FREE T4 REFLEX; Future; Expected date: 12/28/2020    Malignant neoplasm of lower lobe of right lung (HCC)    Asymptomatic menopausal state    Colon cancer screening  -     AMB REFERRAL TO GI LAB FOR PROCEDURE    Well woman exam  -     COMPREHENSIVE METABOLIC PANEL; Future; Expected date: 12/27/2021  -     LIPID PROFILE; Future; Expected date: 12/27/2021  -     CBC; Future; Expected date: 12/27/2021  -     TSH WITH FREE T4 REFLEX; Future; Expected date: 12/27/2021  - COMPREHENSIVE METABOLIC PANEL; Future; Expected date: 12/28/2020  -     LIPID PROFILE; Future; Expected date: 12/28/2020  -     CBC; Future; Expected date: 12/28/2020  -     TSH WITH FREE T4 REFLEX; Future; Expected date: 12/28/2020    Chronic migraine without 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    Anemia, unspecified type    Mood disorder (HCC)    Other orders  -     varicella-zoster gE-AS01B (PF) (SHINGRIX (PF)) 50 mcg/0.5 mL injection; Inject 0.5 mL into the muscle once for 1 dose. IM: 0.5 mL administered as a 2-dose series at 0 and 2 to 6 months  Fax verification of vaccination to [phone]  -     COVID-19 BIVALENT BOOSTER (70YR+)(PFIZER) VAC 30MCG/0.3ML  -     PNEUMOCOCCAL VACCINE 20-VAL  -     atorvastatin (LIPITOR) 10 mg tablet; Take one tablet by mouth at bedtime daily.  -     escitalopram oxalate (LEXAPRO) 20 mg tablet; Take one tablet by mouth daily.  -     SUMAtriptan succinate (IMITREX) 25 mg tablet; Take one tablet by mouth at onset of headache. May repeat after 2 hours if needed. Max of 200 mg in 24 hours.       You are due for these screening tests and/ or vaccines.  If you believe you have completed these items, please bring [place] these records or you can fax them to [phone].   Health Maintenance Due   Topic Date Due   ? SHINGLES RECOMBINANT VACCINE (1 of 2) 10/30/2004   ? OSTEOPOROSIS SCREENING/MONITORING  Never done   ? COVID-19 VACCINE (4 - Booster for Pfizer series) 01/16/2020   ? PNEUMOCOCCAL VACCINE (2 - PCV) 11/24/2020   ? COLORECTAL CANCER SCREENING  12/13/2020        You have completed these vaccines:  Immunization History   Administered Date(s) Administered   ? COVID-19 (PFIZER), mRNA vacc, 30 mcg/0.3 mL (PF) 04/16/2019, 05/07/2019, 11/21/2019   ? Flu Vaccine =>6 Months Quadrivalent PF 12/04/2016, 11/15/2017   ? Flu Vaccine =>65 YO High-Dose Quadrivalent (PF) 11/21/2019   ? Flu Vaccine Cell Cult Quad 4+YO (PF)(Flucelvax) 11/25/2018   ? Flu vaccine, inj unspecified (Historical) 11/12/2018, 12/23/2020   ? Pneumococcal Vaccine (23-Val Adult) 11/25/2019   ? Td vaccine (>=7YO) PF IM (Decavac, Tenivac) 11/15/2017   ? Tdap Vaccine 02/07/2004   ? Zoster Vaccine, Unspecified (Historical) 09/27/2015        If you ever have emergency symptoms of chest pain, shortness of breath or uncontrolled or unexplained pain, please go to your closest emergency room.  You can give [place] an update after you have addressed any emergency.     How to Keep Your Brain Healthy   1. Physical exercise: Physical exercise allows more blood flow to get to your brain.  All exercise is better than none, but gardening and walking are like tier 1 of exercise and will not get your heart rate higher than 100 in many instances. Weight training, yoga, and resistance bands are like tier 2 level. We see the real benefits at tier 3 level of exercise, which involves aerobic or cardiovascular exercise. Cardio exercise involves getting your heart rate up and a real sweat going.  This level of exercise can release BDNF (brain derived neurotrophic factor) which helps stabilize neurons - we think of it as fertilizer for those brain cells.  Research studies suggest symptoms progress slower and brain atrophy slows in cardio intensive exercise groups.  This can be done with high intensity workouts, running, biking, stationary biking, rowing machine, or in a swimming pool.  Talk to your health care provider before doing strenuous exercise. The ultimate goal is to get 150 minutes of moderate to intense exercise each week, most easily divided up into 30 minutes per day 5 days per week. It is recommended that one eases into this and slowly increases their amount of exercise over weeks to months. If you have never exercised before or are in poor exercise shape, it is recommended that you seek a personal trainer or join a local gym with programs directed for your age/exercise level.  2. Diet: Eating a heart-healthy diet may help protect brain function. Further information about the Mediterranean/MIND diet is listed below.  Talk to your primary medical provider about your specific diet needs.     3. Sleep: The toxic proteins that we all build up during the day and are associated with neurodegeneration of neurons are likely only cleared during deep, slow wave stages of sleep. If deep sleep is disrupted, fewer proteins are cleared. Sleep is also important for memory consolidation and restorative aspects to help with attention of tasks throughout the next day.  The average person requires 7-9 hours of quality sleep per night.  Try to maintain a sleep routine.  Avoid caffeine in the afternoon and heavy meals after 7 pm.  Talk to your local medical provider if you are having problems with sleeping (for example: insomnia, obstructive sleep apnea, excessive daytime sleepiness, or not sleeping enough).     4. Mental exercise: Push your brain with new things and new places.  Take a community education class, or attend a lecture at [employer]. Doing activities, such as reading, learning an instrument or a new language is healthy for our brains. Utilize cognitive training apps or programs. There is no evidence currently that one works better than other, but make sure not to focus on one thing (crosswords, Sudoku puzzles, computer-based games, etc.)  Make sure to have a broad cognitive program.     5. Stress Reduction: Simplify your life as much as possible. Meditate, do yoga or tai chi, or perform deep breathing, etc.  Stress will amplify any underlying problem.       6. Spiritual Fitness: Find something you are passionate about and that gives your life meaning and purpose each day.  Get involved.  Stay active in your local community and with social functions.     7. Getting involved in research: This can be empowering for many who are involved in research. It gives individuals a sense of purpose and fighting back against this disease. The first person who will be cured of a neurodegenerative disease will be from a research study and helping [place] move that science forward is a major contribution.     Mediterranean Diet   The Mediterranean Diet and the MIND diet are the best studied across neurodegenerative diseases.  Both have shown a reduced risk of Alzheimer's disease across multiple studies, ranging from 35-50% reduced risk.  The Mediterranean diet is considered to be a heart-healthy diet as well.  Interested in trying the Mediterranean diet? These tips will help you get started:  ? Eat more fruits and vegetables. Aim for 7 to 10 servings a day of fruit and vegetables.  ? Opt for whole grains. Switch to whole-grain bread, cereal and pasta. Experiment with other whole grains, such as bulgur and farro.  ? Use healthy fats. Try olive oil as a replacement for butter when cooking. Instead of putting butter or margarine on bread, try dipping it in flavored olive oil.  ? Eat more seafood. Eat fish twice a week. Fresh or water-packed tuna, salmon, trout, mackerel and herring are healthy choices. Grilled fish tastes good and requires little cleanup. Avoid deep-fried fish.  ? Reduce red meat. Substitute fish, poultry or beans for meat. If you eat meat, make sure it's lean and keep portions small.  ? Enjoy some dairy. Eat low-fat [place] or plain yogurt and small amounts of a variety of cheeses.  ? Spice it up. Herbs and spices boost flavor and lessen  the need for salt.     Utilize a high rated book on the subject or a good [employer] as well: https://www.helpguide.org/articles/diets/the-mediterranean-diet.htm     Great to see you today!  Dr. Hollace Kinnier and the [facility], [facility]. Kelly's direct number is [phone] (not answered outside business hours).  Scheduling is [phone]; our fax is [phone].       Visit Disposition     Dispositions    ? Return in about 1 year (around 12/28/2021), or physical + MAWV 40, for provider spot in [facility] ONLY [facility],use opening1st, next appt in clinic.                          No future appointments.

## 2021-01-01 ENCOUNTER — Encounter: Admit: 2021-01-01 | Discharge: 2021-01-01 | Payer: MEDICARE

## 2021-01-01 DIAGNOSIS — G43709 Chronic migraine without aura, not intractable, without status migrainosus: Secondary | ICD-10-CM

## 2021-01-01 MED ORDER — ESCITALOPRAM OXALATE 20 MG PO TAB
ORAL_TABLET | Freq: Every day | 3 refills
Start: 2021-01-01 — End: ?

## 2021-01-01 MED ORDER — TOPIRAMATE 100 MG PO TAB
ORAL_TABLET | Freq: Two times a day (BID) | 3 refills
Start: 2021-01-01 — End: ?

## 2021-01-03 ENCOUNTER — Encounter: Admit: 2021-01-03 | Discharge: 2021-01-03 | Payer: MEDICARE

## 2021-01-03 ENCOUNTER — Ambulatory Visit: Admit: 2021-01-03 | Discharge: 2021-01-03 | Payer: MEDICARE

## 2021-01-03 DIAGNOSIS — M79645 Pain in left finger(s): Secondary | ICD-10-CM

## 2021-01-03 DIAGNOSIS — S6292XA Unspecified fracture of left wrist and hand, initial encounter for closed fracture: Secondary | ICD-10-CM

## 2021-01-03 NOTE — Patient Instructions
While we would like you to obtain your advanced imaging at a [place] facility, we understand that scheduling and location availability may not always fit with your schedule. Below is a list of imaging facilities for you to choose from. Please contact our office once you have scheduled with one of these facilities so we can fax your radiology order to them and also ensure you have a follow-up appointment scheduled. When scheduling at a facility outside of [place], please inquire that they are able to "cloud" (electronically send) your results to [place]. If they are unable to, you will need to bring a copy of the images on a disc and the radiology report with you to your follow-up appointment.    [facility]         [phone]         [address], [place] [address]    2.    [facility]    A. [place] - [phone] --- [address], [place] [address]   B. [place] - [phone] --- [address], [place] [address]   C. [place] - [phone] --- [address], [place] [address]    3. [facility]   A. [place] - [phone] --- [place], [place]   B. [place] - [phone] --- [place], [place], [place], [place], [place], [place], [place]    4. [place] locations   A. [facility] [phone]   B. [facility] [phone]   C. [facility] [phone]    5. [place] locations   A. [facility] [phone]   B. [facility] [phone]

## 2021-01-04 ENCOUNTER — Encounter: Admit: 2021-01-04 | Discharge: 2021-01-04 | Payer: MEDICARE

## 2021-01-04 NOTE — Telephone Encounter
RN faxed requested MRI order, demographics and office notes to [facility]. Pt scheduled tomorrow. Fax# [phone]. Confirmation fax receipt obtained.

## 2021-01-12 ENCOUNTER — Ambulatory Visit: Admit: 2021-01-12 | Discharge: 2021-01-12 | Payer: MEDICARE

## 2021-01-12 ENCOUNTER — Encounter: Admit: 2021-01-12 | Discharge: 2021-01-12 | Payer: MEDICARE

## 2021-01-12 DIAGNOSIS — H902 Conductive hearing loss, unspecified: Secondary | ICD-10-CM

## 2021-01-12 DIAGNOSIS — J209 Acute bronchitis, unspecified: Secondary | ICD-10-CM

## 2021-01-12 DIAGNOSIS — M7711 Lateral epicondylitis, right elbow: Secondary | ICD-10-CM

## 2021-01-12 DIAGNOSIS — C3490 Malignant neoplasm of unspecified part of unspecified bronchus or lung: Secondary | ICD-10-CM

## 2021-01-12 DIAGNOSIS — F419 Anxiety disorder, unspecified: Secondary | ICD-10-CM

## 2021-01-12 DIAGNOSIS — F39 Unspecified mood [affective] disorder: Secondary | ICD-10-CM

## 2021-01-12 DIAGNOSIS — S62647D Nondisplaced fracture of proximal phalanx of left little finger, subsequent encounter for fracture with routine healing: Secondary | ICD-10-CM

## 2021-01-12 DIAGNOSIS — M1990 Unspecified osteoarthritis, unspecified site: Secondary | ICD-10-CM

## 2021-01-12 DIAGNOSIS — Z780 Asymptomatic menopausal state: Secondary | ICD-10-CM

## 2021-01-12 DIAGNOSIS — J302 Other seasonal allergic rhinitis: Secondary | ICD-10-CM

## 2021-01-12 DIAGNOSIS — B977 Papillomavirus as the cause of diseases classified elsewhere: Secondary | ICD-10-CM

## 2021-01-12 DIAGNOSIS — R232 Flushing: Secondary | ICD-10-CM

## 2021-01-12 DIAGNOSIS — G43909 Migraine, unspecified, not intractable, without status migrainosus: Secondary | ICD-10-CM

## 2021-01-12 DIAGNOSIS — Z8669 Personal history of other diseases of the nervous system and sense organs: Secondary | ICD-10-CM

## 2021-01-12 DIAGNOSIS — N390 Urinary tract infection, site not specified: Secondary | ICD-10-CM

## 2021-01-12 DIAGNOSIS — E785 Hyperlipidemia, unspecified: Secondary | ICD-10-CM

## 2021-01-12 MED ORDER — PROMETHAZINE-CODEINE 6.25-10 MG/5 ML PO SYRP
5 mL | Freq: Every evening | ORAL | 0 refills | 3.00000 days | Status: AC | PRN
Start: 2021-01-12 — End: ?

## 2021-01-12 MED ORDER — BENZONATATE 100 MG PO CAP
100 mg | ORAL_CAPSULE | ORAL | 0 refills | 9.00000 days | Status: AC
Start: 2021-01-12 — End: ?

## 2021-01-12 NOTE — Progress Notes
Did patient read financial policy, consent to treat, and notice of privacy practices? Yes    Does the patient give verbal consent to each policy? Yes    Does the patient have any vitals to report? No    N/A Vitals charted in O2? N/A    Is the patient in pain? 0 = No pain    Screening questions completed? Yes    Is the patient able to acces the Mychart message with the start visit link? Yes    Is patient in "virtual waiting room" Yes    Answers for HPI/ROS submitted by the patient on 01/11/2021  Chronicity: new  Onset: in the past 7 days  Progression since onset: gradually worsening  Frequency: every few minutes  Cough characteristics: productive of sputum  chest pain: Yes  chills: No  ear congestion: Yes  ear pain: No  fever: No  headaches: Yes  heartburn: No  hemoptysis: No  myalgias: Yes  nasal congestion: Yes  postnasal drip: Yes  rash: No  rhinorrhea: No  shortness of breath: No  sore throat: Yes  sweats: No  weight loss: No  wheezing: No  Aggravated by: nothing

## 2021-01-12 NOTE — Progress Notes
Cough  This is a new problem. The current episode started in the past 7 days. The problem has been gradually worsening. The problem occurs every few minutes. The cough is productive of sputum. Associated symptoms include chest pain, ear congestion, headaches, myalgias, nasal congestion, postnasal drip and a sore throat. Pertinent negatives include no chills, ear pain, fever, heartburn, hemoptysis, rash, rhinorrhea, shortness of breath, sweats, weight loss or wheezing. Nothing aggravates the symptoms.     Melinda Goodman is a 65 y.o. female.   Patient reports sick for 9 days w/ cough, fever, headache, sputum production.  Patient reports pain w/ coughing.   Patient reports home covid test negative.  Patient reports pain behind eyes.  Patient reports normal temp.  Patient reports green thick discharge in sputum.   Patient reports was sick for 5 days after recent vaccines.    Patient reports had labs, xrays, MRI.   Patient reports over migraines.   Patient reports poor energy. Patient reports myalgias.   Patient reports felt better today.   No Known Allergies     Health Maintenance Due   Topic Date Due   ? MEDICARE ANNUAL WELLNESS VISIT  Never done   ? SHINGLES RECOMBINANT VACCINE (1 of 2) 10/30/2004   ? COLORECTAL CANCER SCREENING  12/13/2020      No future appointments.              Objective:         ? atorvastatin (LIPITOR) 40 mg tablet Take one tablet by mouth at bedtime daily.   ? benzonatate (TESSALON PERLES) 100 mg capsule Take one capsule by mouth every 8 hours.   ? escitalopram oxalate (LEXAPRO) 20 mg tablet Take one tablet by mouth daily.   ? fexofenadine(+) (ALLEGRA) 180 mg PO tablet Take 180 mg by mouth Every Morning.   ? ibuprofen/diphenhydramine cit (ADVIL PM PO) Take  by mouth.   ? promethazine-codeine (PHENERGAN-CODEINE) 6.25-10 mg/5 mL oral syrup Take 5 mL by mouth at bedtime as needed for Cough.   ? SUMAtriptan succinate (IMITREX) 25 mg tablet Take one tablet by mouth at onset of headache. May repeat after 2 hours if needed. Max of 200 mg in 24 hours.   ? topiramate (TOPAMAX) 100 mg tablet Take one tablet by mouth twice daily.     There were no vitals filed for this visit.  There were no vitals filed for this visit.    There is no height or weight on file to calculate BMI.     Physical Exam  Vitals signs and nursing note reviewed.   Constitutional:       Appearance:   is well-developed.   HENT:      Head: Normocephalic and atraumatic.   Neck:      Musculoskeletal: Normal range of motion.   Pulmonary:      Effort: Pulmonary effort is normal.   Skin:     General: Skin is warm and dry.   Neurological:      Mental Status:   is alert and oriented to person, place, and time.   Psychiatric:         Behavior: Behavior normal.         Thought Content: Thought content normal.         Judgment: Judgment normal.    Labwork reviewed:  Lab Results   Component Value Date/Time    HGBA1C 4.9 03/12/2009 02:16 PM    TSH 4.05 12/28/2020 10:39 AM    CHOL 274 (  H) 12/28/2020 10:39 AM    TRIG 137 12/28/2020 10:39 AM    HDL 55 12/28/2020 10:39 AM    LDL 203 (H) 12/28/2020 10:39 AM    NA 141 12/28/2020 10:39 AM    K 4.0 12/28/2020 10:39 AM    CL 109 12/28/2020 10:39 AM    CO2 24 12/28/2020 10:39 AM    GAP 8 12/28/2020 10:39 AM    BUN 17 12/28/2020 10:39 AM    CR 1.10 (H) 12/28/2020 10:39 AM    GLU 81 12/28/2020 10:39 AM    GLU 116 (H) 09/07/2016 02:52 PM    CA 9.3 12/28/2020 10:39 AM    ALBUMIN 4.2 12/28/2020 10:39 AM    TOTPROT 7.1 12/28/2020 10:39 AM    ALKPHOS 74 12/28/2020 10:39 AM    AST 17 12/28/2020 10:39 AM    ALT 14 12/28/2020 10:39 AM    TOTBILI 0.4 12/28/2020 10:39 AM    GFR 40 (L) 11/25/2019 03:56 PM    GFRAA 49 (L) 11/25/2019 03:56 PM            Assessment and Plan:    Problem   Acute Bronchitis    Chest xray  Tessalon/ phenergan w/ codeine                           Ample opportunity given for questions and answers.  Patient Instructions     Thank you for coming in today!  Our plan from this visit:  The lifestyle change that you have decided on today is:   Exercise.  Hydrate with at least 64 ounces of water daily unless you are on a fluid restriction.   Follow low sodium diet, 2000 mg/day or less If you are having swelling.           For There is no height or weight on file to calculate BMI.  and    Wt Readings from Last 1 Encounters:   01/03/21 72.1 kg (159 lb)    Your treatment plan includes a nutritionally balanced, reduced calorie diet, exercise and behavioral counseling.        xray  at [employer]    Tessalon and phenergan w/ codeine for cough      The Common Cold & Viral Upper Respiratory Illness  (Viral URI)    What should I do if I think that I have a viral upper respiratory infection (URI)?  Get plenty of rest and drink plenty of fluids. Also take one or more of the following  medications:    For fever and pain, Acetaminophen (Tylenol?) is generally preferred. Ibuprofen  (Advil?) and/or naproxen (Naprosyn?) appear to carry less risk for Reye's syndrome  than aspirin.    For other symptoms, over-the-counter cold (OTC) preparations (Nyquil?, Tylenol  Cold? & Sinus?, others) can provide significant relief. Be sure to read product labels  to find the best cold preparation to match your symptoms and to determine if that  medicine is safe for you.    To dry out the nose and relieve nasal obstruction, try a traditional antihistamine  (diphenhydramine [Benadryl?], others). All antihistamines are best used for allergy,  but they can be helpful for suppressing some viral URI symptoms. Because these  products can make you sleepy, avoid driving and other complex tasks when taking  these medications. Newly available OTC loratadine (Claritin?) is non-sedating but may  not be as effective for suppressing viral URI symptoms.  To relieve a stuffy, clogged nose, try an oral decongestant (pseudoephedrine  [Sudafed?], others). Note that these products can be associated with insomnia,  nervousness, and irritability in some patients. Often decongestants are combined with  other drugs (especially antihistamines) in OTC medications. A -D at the end of a  medication's name suggests that the medication includes an oral decongestant.    To make blowing your nose easier, take guaifenesin (Robitussin?, Mucofen?,  Humibid LA?, Mucinex?, Humibid-e?). These products thin mucous and can help  thin thick discolored drainage.    To suppress coughing, take dextromethorphan can be helpful as a cough suppressant.  Prescription anti-flu medications (amantadine, rimantadine, zanamivir, oseltamivir) can be  used to treat and prevent flu. Typically they must be started within 48 hours of the start of  symptoms. These products shorten the severity and duration of the infection, but they can  have significant side effects.    When should I seek treatment?  Viral infections can be associated with bacterial overgrowth and occasionally lead to a  bacterial infection (acute bacterial rhinosinusitis), which typically requires antibiotic therapy.  Viral URIs also may worsen asthma symptoms (wheezing) in patients with asthma; such  symptoms also require further evaluation and treatment.  Seek medical advice or treatment if:  Symptoms are getting worse after 7 days  Symptoms are unchanged or getting worse after 10 days  You experience shortness of breath or have any respiratory difficulty  ou experience a high fever (> 102o F)z You develop eye pain/ swelling and/or vision changes  You develop severe head or facial pain/swelling    How can I prevent viral URIs?  Wash your hands frequently  Cold and flu viruses are spread by touching infected persons or objects that have come in  contact with the virus and then touching one's nose or mouth. Frequent handwashing is  important to prevent this process. (Inhalation of infected particles in the air also can spread  colds/respiratory viral infections.)    Did You Know That. . .   More than 200 different viruses can cause the common cold (viral URI).   Viruses do not respond to antibiotic treatment.  Symptoms due to viral URI typically last 2 - 14 days, but some symptoms can linger for  several weeks. (Most people recover in about 7-10 days.)  Productive cough or discolored nasal discharge does not necessarily require antibiotic  therapy.  Influenza (flu) is a viral infection caused by the influenza virus.    For more information about colds and viral URIs, visit these websites:  The [employer] of [employer] (GolfingPosters.tn)  Center for Disease Control and Prevention (FootballExhibition.com.br)      We practice evidence based medicine, this means that we use the current best scientific evidence in making decisions about the care of individual patients.   Please read about medical issues including covid and influenza vaccines only in peer reviewed medical journals- I recommend [place] Journal of Medicine and Annals of Internal Medicine, both are available online for free.    Many patients do their labs a few days before their appointment with me and we will plan to discuss the results at that appointment.  But, as a part of the CARES act, starting 05/08/2019, some results will be released to mychart automatically.  I will continue to send a note on any labs that I order.  But, with these changes you may see your results before I do.  Critical lab results will be addressed immediately, but otherwise  please  give me 72 hours to view and respond to your results before reaching out with questions.       We are in clinic on Mondays AM, Tuesdays AM,  Wednesday all day, Thursdays PM and Fridays AM.  We are doing telehealth appointments on some Wednesdays.      If you have an urgent issue that you believe can be taken care of in our clinic by being overbooked on a Monday, Tuesday or Friday am, then Please call the nurse, Tresa Endo, at [phone] to be added to our schedule.     There are no diagnoses linked to this encounter.     You are due for these screening tests and/ or vaccines.  If you believe you have completed these items, please bring [place] these records or you can fax them to [phone].   Health Maintenance Due   Topic Date Due   ? MEDICARE ANNUAL WELLNESS VISIT  Never done   ? SHINGLES RECOMBINANT VACCINE (1 of 2) 10/30/2004   ? COLORECTAL CANCER SCREENING  12/13/2020        You have completed these vaccines:  Immunization History   Administered Date(s) Administered   ? COVID-19 (PFIZER), mRNA vacc, 30 mcg/0.3 mL (PF) 04/16/2019, 05/07/2019, 11/21/2019   ? COVID-19 Bivalent Booster (36YR+)(PFIZER), mRNA vacc, 52mcg/0.3mL 12/28/2020   ? Flu Vaccine =>6 Months Quadrivalent PF 12/04/2016, 11/15/2017   ? Flu Vaccine =>65 YO High-Dose Quadrivalent (PF) 11/21/2019   ? Flu Vaccine Cell Cult Quad 4+YO (PF)(Flucelvax) 11/25/2018   ? Flu vaccine, inj unspecified (Historical) 11/12/2018, 12/23/2020   ? Pneumococcal Vaccine (20-Val) 12/28/2020   ? Pneumococcal Vaccine (23-Val Adult) 11/25/2019   ? Td vaccine (>=7YO) PF IM (Decavac, Tenivac) 11/15/2017   ? Tdap Vaccine 02/07/2004   ? Zoster Vaccine, Unspecified (Historical) 09/27/2015        If you ever have emergency symptoms of chest pain, shortness of breath or uncontrolled or unexplained pain, please go to your closest emergency room.  You can give [place] an update after you have addressed any emergency.     How to Keep Your Brain Healthy   1. Physical exercise: Physical exercise allows more blood flow to get to your brain.  All exercise is better than none, but gardening and walking are like tier 1 of exercise and will not get your heart rate higher than 100 in many instances. Weight training, yoga, and resistance bands are like tier 2 level. We see the real benefits at tier 3 level of exercise, which involves aerobic or cardiovascular exercise. Cardio exercise involves getting your heart rate up and a real sweat going.  This level of exercise can release BDNF (brain derived neurotrophic factor) which helps stabilize neurons - we think of it as fertilizer for those brain cells.  Research studies suggest symptoms progress slower and brain atrophy slows in cardio intensive exercise groups.  This can be done with high intensity workouts, running, biking, stationary biking, rowing machine, or in a swimming pool.  Talk to your health care provider before doing strenuous exercise. The ultimate goal is to get 150 minutes of moderate to intense exercise each week, most easily divided up into 30 minutes per day 5 days per week. It is recommended that one eases into this and slowly increases their amount of exercise over weeks to months. If you have never exercised before or are in poor exercise shape, it is recommended that you seek a personal trainer or join a local gym with programs  directed for your age/exercise level.     2. Diet: Eating a heart-healthy diet may help protect brain function. Further information about the Mediterranean/MIND diet is listed below.  Talk to your primary medical provider about your specific diet needs.     3. Sleep: The toxic proteins that we all build up during the day and are associated with neurodegeneration of neurons are likely only cleared during deep, slow wave stages of sleep. If deep sleep is disrupted, fewer proteins are cleared. Sleep is also important for memory consolidation and restorative aspects to help with attention of tasks throughout the next day.  The average person requires 7-9 hours of quality sleep per night.  Try to maintain a sleep routine.  Avoid caffeine in the afternoon and heavy meals after 7 pm.  Talk to your local medical provider if you are having problems with sleeping (for example: insomnia, obstructive sleep apnea, excessive daytime sleepiness, or not sleeping enough).     4. Mental exercise: Push your brain with new things and new places.  Take a community education class, or attend a lecture at [employer]. Doing activities, such as reading, learning an instrument or a new language is healthy for our brains. Utilize cognitive training apps or programs. There is no evidence currently that one works better than other, but make sure not to focus on one thing (crosswords, Sudoku puzzles, computer-based games, etc.)  Make sure to have a broad cognitive program.     5. Stress Reduction: Simplify your life as much as possible. Meditate, do yoga or tai chi, or perform deep breathing, etc.  Stress will amplify any underlying problem.       6. Spiritual Fitness: Find something you are passionate about and that gives your life meaning and purpose each day.  Get involved.  Stay active in your local community and with social functions.     7. Getting involved in research: This can be empowering for many who are involved in research. It gives individuals a sense of purpose and fighting back against this disease. The first person who will be cured of a neurodegenerative disease will be from a research study and helping [place] move that science forward is a major contribution.     Mediterranean Diet   The Mediterranean Diet and the MIND diet are the best studied across neurodegenerative diseases.  Both have shown a reduced risk of Alzheimer's disease across multiple studies, ranging from 35-50% reduced risk.  The Mediterranean diet is considered to be a heart-healthy diet as well.  Interested in trying the Mediterranean diet? These tips will help you get started:  ? Eat more fruits and vegetables. Aim for 7 to 10 servings a day of fruit and vegetables.  ? Opt for whole grains. Switch to whole-grain bread, cereal and pasta. Experiment with other whole grains, such as bulgur and farro.  ? Use healthy fats. Try olive oil as a replacement for butter when cooking. Instead of putting butter or margarine on bread, try dipping it in flavored olive oil.  ? Eat more seafood. Eat fish twice a week. Fresh or water-packed tuna, salmon, trout, mackerel and herring are healthy choices. Grilled fish tastes good and requires little cleanup. Avoid deep-fried fish.  ? Reduce red meat. Substitute fish, poultry or beans for meat. If you eat meat, make sure it's lean and keep portions small.  ? Enjoy some dairy. Eat low-fat [place] or plain yogurt and small amounts of a variety of cheeses.  ? Spice  it up. Herbs and spices boost flavor and lessen the need for salt.     Utilize a high rated book on the subject or a good Solicitor as well: https://www.helpguide.org/articles/diets/the-mediterranean-diet.htm     Great to see you today!  Dr. Hollace Kinnier and the General [facility] Medicine Clinic, [facility]. Kelly's direct number is [phone] (not answered outside business hours).  Scheduling is [phone]; our fax is [phone].         No follow-ups on file.      Obtained patient's verbal consent to treat them and their agreement to [facility] financial policy and NPP via this telehealth visit during the [facility].    Patient seen in audiovisual communication using the Zoom app.    Time based exception:     Visit start time: 1252     Visit end time: 1300  Total time  and  Estimated counseling time: 8 + 5 minutes pre and post visit documentation.  Patient Counseling:  Rhoderick Moody was seen today for cough.    Diagnoses and all orders for this visit:    Acute bronchitis, unspecified organism  -     CHEST 2 VIEWS; Future; Expected date: 01/12/2021    Other orders  -     benzonatate (TESSALON PERLES) 100 mg capsule; Take one capsule by mouth every 8 hours.  -     promethazine-codeine (PHENERGAN-CODEINE) 6.25-10 mg/5 mL oral syrup; Take 5 mL by mouth at bedtime as needed for Cough.    .   Time-based billing documentation  The total time spent in this patient's care during the day of the visit was 13 minutes.  This includes but is not limited to time spent preparing to see the patient, performing a medically appropriate interview, examination and evaluation, counseling and education, documenting clinical information in the electronic health record, and coordinating care.      Time was also spent:   Ordering medications

## 2021-01-13 ENCOUNTER — Encounter: Admit: 2021-01-13 | Discharge: 2021-01-13 | Payer: MEDICARE

## 2021-01-19 ENCOUNTER — Encounter: Admit: 2021-01-19 | Discharge: 2021-01-19 | Payer: MEDICARE

## 2021-01-22 ENCOUNTER — Encounter: Admit: 2021-01-22 | Discharge: 2021-01-22 | Payer: MEDICARE

## 2021-01-22 NOTE — Telephone Encounter
RN sent pt MyChart message notifying her we had received her MRI results and she should schedule a follow up appt with Dr. Patsey Berthold to discuss results. Visit should be in-person. RN requested pt reply via MyChart with preference for scheduling.

## 2021-02-01 ENCOUNTER — Encounter: Admit: 2021-02-01 | Discharge: 2021-02-01 | Payer: MEDICARE

## 2021-02-01 DIAGNOSIS — S62647D Nondisplaced fracture of proximal phalanx of left little finger, subsequent encounter for fracture with routine healing: Secondary | ICD-10-CM

## 2021-02-02 ENCOUNTER — Encounter: Admit: 2021-02-02 | Discharge: 2021-02-02 | Payer: MEDICARE

## 2021-02-02 DIAGNOSIS — R69 Illness, unspecified: Secondary | ICD-10-CM

## 2021-02-03 ENCOUNTER — Ambulatory Visit: Admit: 2021-02-03 | Discharge: 2021-02-03 | Payer: MEDICARE

## 2021-02-03 ENCOUNTER — Encounter: Admit: 2021-02-03 | Discharge: 2021-02-03 | Payer: MEDICARE

## 2021-02-03 DIAGNOSIS — F419 Anxiety disorder, unspecified: Secondary | ICD-10-CM

## 2021-02-03 DIAGNOSIS — E785 Hyperlipidemia, unspecified: Secondary | ICD-10-CM

## 2021-02-03 DIAGNOSIS — S62647D Nondisplaced fracture of proximal phalanx of left little finger, subsequent encounter for fracture with routine healing: Secondary | ICD-10-CM

## 2021-02-03 DIAGNOSIS — J302 Other seasonal allergic rhinitis: Secondary | ICD-10-CM

## 2021-02-03 DIAGNOSIS — H902 Conductive hearing loss, unspecified: Secondary | ICD-10-CM

## 2021-02-03 DIAGNOSIS — M1990 Unspecified osteoarthritis, unspecified site: Secondary | ICD-10-CM

## 2021-02-03 DIAGNOSIS — Z8669 Personal history of other diseases of the nervous system and sense organs: Secondary | ICD-10-CM

## 2021-02-03 DIAGNOSIS — Z780 Asymptomatic menopausal state: Secondary | ICD-10-CM

## 2021-02-03 DIAGNOSIS — F39 Unspecified mood [affective] disorder: Secondary | ICD-10-CM

## 2021-02-03 DIAGNOSIS — C3490 Malignant neoplasm of unspecified part of unspecified bronchus or lung: Secondary | ICD-10-CM

## 2021-02-03 DIAGNOSIS — G43909 Migraine, unspecified, not intractable, without status migrainosus: Secondary | ICD-10-CM

## 2021-02-03 DIAGNOSIS — N390 Urinary tract infection, site not specified: Secondary | ICD-10-CM

## 2021-02-03 DIAGNOSIS — B977 Papillomavirus as the cause of diseases classified elsewhere: Secondary | ICD-10-CM

## 2021-02-03 DIAGNOSIS — M7711 Lateral epicondylitis, right elbow: Secondary | ICD-10-CM

## 2021-02-03 DIAGNOSIS — R232 Flushing: Secondary | ICD-10-CM

## 2021-02-25 ENCOUNTER — Ambulatory Visit: Admit: 2021-02-25 | Discharge: 2021-02-25 | Payer: MEDICARE

## 2021-02-25 ENCOUNTER — Encounter: Admit: 2021-02-25 | Discharge: 2021-02-25 | Payer: MEDICARE

## 2021-02-25 DIAGNOSIS — Z1211 Encounter for screening for malignant neoplasm of colon: Secondary | ICD-10-CM

## 2021-02-25 DIAGNOSIS — C3431 Malignant neoplasm of lower lobe, right bronchus or lung: Secondary | ICD-10-CM

## 2021-02-25 DIAGNOSIS — R911 Solitary pulmonary nodule: Secondary | ICD-10-CM

## 2021-03-04 ENCOUNTER — Encounter: Admit: 2021-03-04 | Discharge: 2021-03-04 | Payer: MEDICARE

## 2021-03-04 DIAGNOSIS — R911 Solitary pulmonary nodule: Secondary | ICD-10-CM

## 2021-03-04 DIAGNOSIS — C3431 Malignant neoplasm of lower lobe, right bronchus or lung: Secondary | ICD-10-CM

## 2021-03-04 DIAGNOSIS — Z01419 Encounter for gynecological examination (general) (routine) without abnormal findings: Secondary | ICD-10-CM

## 2021-04-25 ENCOUNTER — Encounter: Admit: 2021-04-25 | Discharge: 2021-04-25 | Payer: MEDICARE

## 2021-04-27 ENCOUNTER — Encounter: Admit: 2021-04-27 | Discharge: 2021-04-27 | Payer: MEDICARE

## 2021-07-14 ENCOUNTER — Encounter: Admit: 2021-07-14 | Discharge: 2021-07-14 | Payer: MEDICARE

## 2021-07-14 DIAGNOSIS — N390 Urinary tract infection, site not specified: Secondary | ICD-10-CM

## 2021-07-14 DIAGNOSIS — G43909 Migraine, unspecified, not intractable, without status migrainosus: Secondary | ICD-10-CM

## 2021-07-14 DIAGNOSIS — Z780 Asymptomatic menopausal state: Secondary | ICD-10-CM

## 2021-07-14 DIAGNOSIS — M7711 Lateral epicondylitis, right elbow: Secondary | ICD-10-CM

## 2021-07-14 DIAGNOSIS — H902 Conductive hearing loss, unspecified: Secondary | ICD-10-CM

## 2021-07-14 DIAGNOSIS — F39 Unspecified mood [affective] disorder: Secondary | ICD-10-CM

## 2021-07-14 DIAGNOSIS — E785 Hyperlipidemia, unspecified: Secondary | ICD-10-CM

## 2021-07-14 DIAGNOSIS — F419 Anxiety disorder, unspecified: Secondary | ICD-10-CM

## 2021-07-14 DIAGNOSIS — C3490 Malignant neoplasm of unspecified part of unspecified bronchus or lung: Secondary | ICD-10-CM

## 2021-07-14 DIAGNOSIS — B977 Papillomavirus as the cause of diseases classified elsewhere: Secondary | ICD-10-CM

## 2021-07-14 DIAGNOSIS — J302 Other seasonal allergic rhinitis: Secondary | ICD-10-CM

## 2021-07-14 DIAGNOSIS — R232 Flushing: Secondary | ICD-10-CM

## 2021-07-14 DIAGNOSIS — M1990 Unspecified osteoarthritis, unspecified site: Secondary | ICD-10-CM

## 2021-07-14 DIAGNOSIS — Z8669 Personal history of other diseases of the nervous system and sense organs: Secondary | ICD-10-CM

## 2021-07-14 DIAGNOSIS — S62647D Nondisplaced fracture of proximal phalanx of left little finger, subsequent encounter for fracture with routine healing: Secondary | ICD-10-CM

## 2021-07-21 ENCOUNTER — Encounter: Admit: 2021-07-21 | Discharge: 2021-07-21 | Payer: MEDICARE

## 2021-07-21 ENCOUNTER — Ambulatory Visit: Admit: 2021-07-21 | Discharge: 2021-07-21 | Payer: MEDICARE

## 2021-07-21 DIAGNOSIS — S62647D Nondisplaced fracture of proximal phalanx of left little finger, subsequent encounter for fracture with routine healing: Secondary | ICD-10-CM

## 2021-07-21 DIAGNOSIS — C3490 Malignant neoplasm of unspecified part of unspecified bronchus or lung: Secondary | ICD-10-CM

## 2021-07-21 DIAGNOSIS — E785 Hyperlipidemia, unspecified: Secondary | ICD-10-CM

## 2021-07-21 DIAGNOSIS — B977 Papillomavirus as the cause of diseases classified elsewhere: Secondary | ICD-10-CM

## 2021-07-21 DIAGNOSIS — G43909 Migraine, unspecified, not intractable, without status migrainosus: Secondary | ICD-10-CM

## 2021-07-21 DIAGNOSIS — Z8669 Personal history of other diseases of the nervous system and sense organs: Secondary | ICD-10-CM

## 2021-07-21 DIAGNOSIS — N390 Urinary tract infection, site not specified: Secondary | ICD-10-CM

## 2021-07-21 DIAGNOSIS — J302 Other seasonal allergic rhinitis: Secondary | ICD-10-CM

## 2021-07-21 DIAGNOSIS — M7711 Lateral epicondylitis, right elbow: Secondary | ICD-10-CM

## 2021-07-21 DIAGNOSIS — Z780 Asymptomatic menopausal state: Secondary | ICD-10-CM

## 2021-07-21 DIAGNOSIS — R232 Flushing: Secondary | ICD-10-CM

## 2021-07-21 DIAGNOSIS — F419 Anxiety disorder, unspecified: Secondary | ICD-10-CM

## 2021-07-21 DIAGNOSIS — H902 Conductive hearing loss, unspecified: Secondary | ICD-10-CM

## 2021-07-21 DIAGNOSIS — M1990 Unspecified osteoarthritis, unspecified site: Secondary | ICD-10-CM

## 2021-07-21 DIAGNOSIS — F39 Unspecified mood [affective] disorder: Secondary | ICD-10-CM

## 2021-07-21 MED ORDER — PROPOFOL INJ 10 MG/ML IV VIAL
INTRAVENOUS | 0 refills | Status: DC
Start: 2021-07-21 — End: 2021-07-21

## 2021-07-21 MED ORDER — PROPOFOL 10 MG/ML IV EMUL 20 ML (INFUSION)(AM)(OR)
INTRAVENOUS | 0 refills | Status: DC
Start: 2021-07-21 — End: 2021-07-21

## 2021-07-21 MED ORDER — LIDOCAINE (PF) 20 MG/ML (2 %) IJ SOLN
INTRAVENOUS | 0 refills | Status: DC
Start: 2021-07-21 — End: 2021-07-21

## 2021-07-21 NOTE — Anesthesia Post-Procedure Evaluation
Post-Anesthesia Evaluation    Name: Melinda Goodman      MRN: [id number]     DOB: Jul 18, 1955     Age: 66 y.o.     Sex: female   __________________________________________________________________________     Procedure Information     Anesthesia Start Date/Time: 07/21/21 1113    Procedure: COLONOSCOPY DIAGNOSTIC WITH SPECIMEN COLLECTION BY BRUSHING/ WASHING - FLEXIBLE    Location: [facility] / [place] OR    Surgeons: Reggy Eye, MD          Post-Anesthesia Vitals  BP: 114/68 (06/15 1155)  Temp: 36.6 C (97.9 F) (06/15 1140)  Pulse: 79 (06/15 1155)  Respirations: 18 PER MINUTE (06/15 1155)  SpO2: 98 % (06/15 1155)  SpO2 Pulse: 79 (06/15 1155)  O2 Device: None (Room air) (06/15 1155)  Height: 170.2 cm (5\' 7" ) (06/15 1041)   Vitals Value Taken Time   BP 114/68 07/21/21 1155   Temp 36.6 C (97.9 F) 07/21/21 1140   Pulse 79 07/21/21 1155   Respirations 18 PER MINUTE 07/21/21 1155   SpO2 98 % 07/21/21 1155   O2 Device None (Room air) 07/21/21 1155   ABP     ART BP           Post Anesthesia Evaluation Note    Evaluation location: pre/post  Patient participation: recovered; patient participated in evaluation  Level of consciousness: alert  Pain management: adequate    Hydration: normovolemia  Temperature: 36.0C - 38.4C  Airway patency: adequate    Perioperative Events      Postoperative Status  Cardiovascular status: hemodynamically stable  Respiratory status: spontaneous ventilation  Additional comments: Post-Anesthesia Evaluation Attestation: I reviewed and agree the indicated post-anesthesia care was provided. I have reviewed key portions of the indicated post anesthesia care. I have examined the patient's vitals, physical status, and complications and agree with what is documented.    Staff name:  Sanda Linger, MD Date:  07/21/2021            Perioperative Events  There were no known notable events for this encounter.

## 2021-07-22 ENCOUNTER — Encounter: Admit: 2021-07-22 | Discharge: 2021-07-22 | Payer: MEDICARE

## 2021-07-22 DIAGNOSIS — H902 Conductive hearing loss, unspecified: Secondary | ICD-10-CM

## 2021-07-22 DIAGNOSIS — N390 Urinary tract infection, site not specified: Secondary | ICD-10-CM

## 2021-07-22 DIAGNOSIS — Z780 Asymptomatic menopausal state: Secondary | ICD-10-CM

## 2021-07-22 DIAGNOSIS — M1990 Unspecified osteoarthritis, unspecified site: Secondary | ICD-10-CM

## 2021-07-22 DIAGNOSIS — F39 Unspecified mood [affective] disorder: Secondary | ICD-10-CM

## 2021-07-22 DIAGNOSIS — C3490 Malignant neoplasm of unspecified part of unspecified bronchus or lung: Secondary | ICD-10-CM

## 2021-07-22 DIAGNOSIS — S62647D Nondisplaced fracture of proximal phalanx of left little finger, subsequent encounter for fracture with routine healing: Secondary | ICD-10-CM

## 2021-07-22 DIAGNOSIS — J302 Other seasonal allergic rhinitis: Secondary | ICD-10-CM

## 2021-07-22 DIAGNOSIS — F419 Anxiety disorder, unspecified: Secondary | ICD-10-CM

## 2021-07-22 DIAGNOSIS — Z8669 Personal history of other diseases of the nervous system and sense organs: Secondary | ICD-10-CM

## 2021-07-22 DIAGNOSIS — E785 Hyperlipidemia, unspecified: Secondary | ICD-10-CM

## 2021-07-22 DIAGNOSIS — M7711 Lateral epicondylitis, right elbow: Secondary | ICD-10-CM

## 2021-07-22 DIAGNOSIS — R232 Flushing: Secondary | ICD-10-CM

## 2021-07-22 DIAGNOSIS — B977 Papillomavirus as the cause of diseases classified elsewhere: Secondary | ICD-10-CM

## 2021-07-22 DIAGNOSIS — G43909 Migraine, unspecified, not intractable, without status migrainosus: Secondary | ICD-10-CM

## 2021-08-13 ENCOUNTER — Encounter: Admit: 2021-08-13 | Discharge: 2021-08-13 | Payer: MEDICARE

## 2021-08-16 ENCOUNTER — Encounter: Admit: 2021-08-16 | Discharge: 2021-08-16 | Payer: MEDICARE

## 2021-09-01 ENCOUNTER — Encounter: Admit: 2021-09-01 | Discharge: 2021-09-01 | Payer: MEDICARE

## 2021-09-01 ENCOUNTER — Ambulatory Visit: Admit: 2021-09-01 | Discharge: 2021-09-01 | Payer: MEDICARE

## 2021-09-01 DIAGNOSIS — R911 Solitary pulmonary nodule: Secondary | ICD-10-CM

## 2021-09-01 DIAGNOSIS — C3431 Malignant neoplasm of lower lobe, right bronchus or lung: Secondary | ICD-10-CM

## 2021-09-02 ENCOUNTER — Encounter: Admit: 2021-09-02 | Discharge: 2021-09-02 | Payer: MEDICARE

## 2021-09-02 DIAGNOSIS — Z01419 Encounter for gynecological examination (general) (routine) without abnormal findings: Secondary | ICD-10-CM

## 2021-09-02 DIAGNOSIS — C3431 Malignant neoplasm of lower lobe, right bronchus or lung: Secondary | ICD-10-CM

## 2021-11-14 ENCOUNTER — Encounter: Admit: 2021-11-14 | Discharge: 2021-11-14 | Payer: MEDICARE

## 2022-01-01 ENCOUNTER — Encounter: Admit: 2022-01-01 | Discharge: 2022-01-01 | Payer: MEDICARE

## 2022-01-03 ENCOUNTER — Encounter: Admit: 2022-01-03 | Discharge: 2022-01-03 | Payer: MEDICARE

## 2022-01-03 MED ORDER — ESCITALOPRAM OXALATE 20 MG PO TAB
20 mg | ORAL_TABLET | Freq: Every day | ORAL | 0 refills | Status: AC
Start: 2022-01-03 — End: ?

## 2022-01-03 MED ORDER — ATORVASTATIN 40 MG PO TAB
ORAL_TABLET | 0 refills | Status: AC
Start: 2022-01-03 — End: ?

## 2022-01-03 NOTE — Telephone Encounter
Some refill protocol elements NOT Met  Medication name: Lipitor  Medication Strength: 66m tablet      Labs due   HDL within 360 days    LDL within 360 days    Total Cholesterol within 360 days    Triglycerides within 360 days     Next office visit: 03/29/22.    Provided courtesy refill: 90 day supply.        Some refill protocol elements NOT Met  Medication name: Lexapro  Medication Strength: 267mtablet      Manual message review and Office visit due   Valid encounter within last 6 months    Patient is < 655ears old. Beers Criteria: Use with caution in patients 65+     Next office visit: 03/29/22.    Patient has tolerated in the past.  Will provided courtesy refill: 90 day supply.     LiTruitt LeepRN

## 2022-01-05 ENCOUNTER — Encounter: Admit: 2022-01-05 | Discharge: 2022-01-05 | Payer: MEDICARE

## 2022-01-05 MED ORDER — ATORVASTATIN 40 MG PO TAB
ORAL_TABLET | 0 refills
Start: 2022-01-05 — End: ?

## 2022-01-10 ENCOUNTER — Encounter: Admit: 2022-01-10 | Discharge: 2022-01-10 | Payer: MEDICARE

## 2022-01-10 DIAGNOSIS — G43709 Chronic migraine without aura, not intractable, without status migrainosus: Secondary | ICD-10-CM

## 2022-01-10 MED ORDER — TOPIRAMATE 100 MG PO TAB
100 mg | ORAL_TABLET | Freq: Two times a day (BID) | ORAL | 3 refills
Start: 2022-01-10 — End: ?

## 2022-01-10 NOTE — Telephone Encounter
Requested Prescriptions   Pending Prescriptions Disp Refills   ? topiramate (TOPAMAX) 100 mg tablet [Pharmacy Med Name: TOPIRAMATE 100MG  TABS] 180 tablet 3     Sig: TAKE ONE TABLET BY MOUTH TWICE A DAY       Neurology: Anticonvulsants - Topiramate & Zonisamide Failed - 01/10/2022  4:26 PM        Failed - Valid encounter within last 12 months     Recent Visits  Date Type Provider Dept   01/12/21 Office Visit Telehealth Werner Lean, MD [facility]   12/28/20 Office Visit Werner Lean, MD [facility]   Showing recent visits within past 720 days and meeting all other requirements  Future Appointments  Date Type Provider Dept   03/29/22 Appointment Werner Lean, MD [facility]   Showing future appointments within next 360 days and meeting all other requirements            Failed - Cr in normal range and within 360 days     Creatinine   Date Value Ref Range Status   12/28/2020 1.10 (H) 0.4 - 1.00 MG/DL Final   53/66/4403 4.74 (H) 0.4 - 1.00 MG/DL Final              Failed - CO2 in normal range and within 360 days     CO2   Date Value Ref Range Status   12/28/2020 24 21 - 30 MMOL/L Final   11/25/2019 24 21 - 30 MMOL/L Final              Failed - eGFR in normal range and within 360 days     eGFR Non African American   Date Value Ref Range Status   11/25/2019 40 (L) >60 mL/min Final     Comment:     The eGFR is not validated for use in drug dosing adjustments.  Continue to use   estimated creatinine clearance per dosing reference text.  Please contact the   Clinical Pharmacist for questions.     11/18/2018 51 (L) >60 mL/min Final     Comment:     The eGFR is not validated for use in drug dosing adjustments.  Continue to use   estimated creatinine clearance per dosing reference text.  Please contact the   Clinical Pharmacist for questions.       eGFR African American   Date Value Ref Range Status   11/25/2019 49 (L) >60 mL/min Final     Comment:     The eGFR is not validated for use in drug dosing adjustments.  Continue to use   estimated creatinine clearance per dosing reference text.  Please contact the   Clinical Pharmacist for questions.     11/18/2018 >60 >60 mL/min Final     Comment:     The eGFR is not validated for use in drug dosing adjustments.  Continue to use   estimated creatinine clearance per dosing reference text.  Please contact the   Clinical Pharmacist for questions.       eGFR   Date Value Ref Range Status   12/28/2020 55 (L) >60 mL/min Final     Comment:     eGFR calculated using the CKD-EPIcr_R equation              Passed - Depression Screening completed within the last 12 months

## 2022-03-29 ENCOUNTER — Encounter: Admit: 2022-03-29 | Discharge: 2022-03-29 | Payer: MEDICARE

## 2022-03-29 ENCOUNTER — Ambulatory Visit: Admit: 2022-03-29 | Discharge: 2022-03-30 | Payer: MEDICARE

## 2022-03-29 DIAGNOSIS — M7711 Lateral epicondylitis, right elbow: Secondary | ICD-10-CM

## 2022-03-29 DIAGNOSIS — B977 Papillomavirus as the cause of diseases classified elsewhere: Secondary | ICD-10-CM

## 2022-03-29 DIAGNOSIS — Z1231 Encounter for screening mammogram for malignant neoplasm of breast: Secondary | ICD-10-CM

## 2022-03-29 DIAGNOSIS — G43909 Migraine, unspecified, not intractable, without status migrainosus: Secondary | ICD-10-CM

## 2022-03-29 DIAGNOSIS — H902 Conductive hearing loss, unspecified: Secondary | ICD-10-CM

## 2022-03-29 DIAGNOSIS — F419 Anxiety disorder, unspecified: Secondary | ICD-10-CM

## 2022-03-29 DIAGNOSIS — E785 Hyperlipidemia, unspecified: Secondary | ICD-10-CM

## 2022-03-29 DIAGNOSIS — R232 Flushing: Secondary | ICD-10-CM

## 2022-03-29 DIAGNOSIS — N390 Urinary tract infection, site not specified: Secondary | ICD-10-CM

## 2022-03-29 DIAGNOSIS — J302 Other seasonal allergic rhinitis: Secondary | ICD-10-CM

## 2022-03-29 DIAGNOSIS — Z780 Asymptomatic menopausal state: Secondary | ICD-10-CM

## 2022-03-29 DIAGNOSIS — S62647D Nondisplaced fracture of proximal phalanx of left little finger, subsequent encounter for fracture with routine healing: Secondary | ICD-10-CM

## 2022-03-29 DIAGNOSIS — R911 Solitary pulmonary nodule: Secondary | ICD-10-CM

## 2022-03-29 DIAGNOSIS — Z01419 Encounter for gynecological examination (general) (routine) without abnormal findings: Secondary | ICD-10-CM

## 2022-03-29 DIAGNOSIS — C3431 Malignant neoplasm of lower lobe, right bronchus or lung: Secondary | ICD-10-CM

## 2022-03-29 DIAGNOSIS — F39 Unspecified mood [affective] disorder: Secondary | ICD-10-CM

## 2022-03-29 DIAGNOSIS — C3490 Malignant neoplasm of unspecified part of unspecified bronchus or lung: Secondary | ICD-10-CM

## 2022-03-29 DIAGNOSIS — G43709 Chronic migraine without aura, not intractable, without status migrainosus: Secondary | ICD-10-CM

## 2022-03-29 DIAGNOSIS — Z8669 Personal history of other diseases of the nervous system and sense organs: Secondary | ICD-10-CM

## 2022-03-29 DIAGNOSIS — M1990 Unspecified osteoarthritis, unspecified site: Secondary | ICD-10-CM

## 2022-03-29 MED ORDER — ESCITALOPRAM OXALATE 20 MG PO TAB
20 mg | ORAL_TABLET | Freq: Every day | ORAL | 3 refills | Status: AC
Start: 2022-03-29 — End: ?

## 2022-03-29 MED ORDER — ATORVASTATIN 40 MG PO TAB
40 mg | ORAL_TABLET | Freq: Every evening | ORAL | 3 refills | Status: AC
Start: 2022-03-29 — End: ?

## 2022-03-29 MED ORDER — SUMATRIPTAN SUCCINATE 25 MG PO TAB
ORAL_TABLET | SUBCUTANEOUS | 3 refills | 30.00000 days | Status: AC
Start: 2022-03-29 — End: ?

## 2022-03-29 MED ORDER — SHINGRIX (PF) 50 MCG/0.5 ML IM SUSR
.5 mL | Freq: Once | INTRAMUSCULAR | 1 refills | Status: AC
Start: 2022-03-29 — End: ?

## 2022-03-29 MED ORDER — TOPIRAMATE 100 MG PO TAB
100 mg | ORAL_TABLET | Freq: Two times a day (BID) | ORAL | 3 refills | Status: AC
Start: 2022-03-29 — End: ?

## 2022-03-29 NOTE — Progress Notes
History of Present Illness  Melinda Goodman is a 67 y.o. female.  Patient reports here for physical.    Patient reports right side chest pain in lobectomy scar. Discussed meds, anesthesia referral and she declines.  Patient reports leg cramps, discussed exercise in am and stretching in evening and magnesium OTC.   Gug 13, Patient denies MAWV.   Patient reports URI.  Patient reports 4 days of sinus congestion, rhinitis, sore throat, headache. Patient denies fevers.   Patient reports covid test neg at home.   Patient reports mood good on lexapro.   Patient reports started semaglutide for wt loss.   Body mass index is 23.34 kg/m?Marland Kitchen  Wt Readings from Last 8 Encounters:   03/29/22 67.6 kg (149 lb)   07/21/21 71 kg (156 lb 8.4 oz)   02/03/21 72.1 kg (159 lb)   01/03/21 72.1 kg (159 lb)   12/28/20 72.1 kg (159 lb)   11/25/19 59.4 kg (131 lb)   11/18/18 59.9 kg (132 lb)   11/15/17 72.7 kg (160 lb 3.2 oz)        Depression Screening:  Patient Scores:  PHQ-2: No data recorded PHQ-9: No data recorded  Interventions:  PHQ-2: PHQ-2 Score less than 3: No follow-up or recommendations are necessary at this time (03/28/2022  3:51 PM)   Depression Interventions PHQ-2/9: Interventions: No follow-up or recommendations are necessary at this time (03/28/2022  3:51 PM)           Health Maintenance Due   Topic Date Due    MEDICARE ANNUAL WELLNESS VISIT  Never done    SHINGLES RECOMBINANT VACCINE (1 of 2) 10/30/2004    RSV VACCINE (60 YEARS AND OLDER) (1 - 1-dose 60+ series) Never done    BREAST CANCER SCREENING  12/27/2021    ADVANCE CARE PLANNING DISCUSSION AND DOCUMENTATION  12/28/2021      No future appointments.                Objective:          atorvastatin (LIPITOR) 40 mg tablet Take one tablet by mouth at bedtime daily.    escitalopram oxalate (LEXAPRO) 20 mg tablet Take one tablet by mouth daily.    fexofenadine(+) (ALLEGRA) 180 mg PO tablet Take one tablet by mouth every morning.    ibuprofen/diphenhydramine cit (ADVIL PM PO) Take  by mouth.    SUMAtriptan succinate (IMITREX) 25 mg tablet Take one tablet by mouth at onset of headache. May repeat after 2 hours if needed. Max of 200 mg in 24 hours.    topiramate (TOPAMAX) 100 mg tablet Take one tablet by mouth twice daily.    varicella-zoster gE-AS01B (PF) (SHINGRIX (PF)) 50 mcg/0.5 mL injection Inject 0.5 mL into the muscle once for 1 dose. IM: 0.5 mL administered as a 2-dose series at 0 and 2 to 6 months  Fax verification of vaccination to [phone]     Vitals:    03/29/22 1426   BP: 99/77   Pulse: 90   Temp: 36.5 ?C (97.7 ?F)   Resp: 16   TempSrc: Oral   PainSc: Zero   Weight: 67.6 kg (149 lb)   Height: 170.2 cm (5' 7)     Vitals:    03/29/22 1426   BP: 99/77   Pulse: 90   Temp: 36.5 ?C (97.7 ?F)   Resp: 16   TempSrc: Oral   PainSc: Zero   Weight: 67.6 kg (149 lb)   Height: 170.2 cm (5' 7)  Body mass index is 23.34 kg/m?Marland Kitchen     Physical Exam  Vitals and nursing note reviewed.   Constitutional:       Appearance: She is well-developed.   HENT:      Head: Normocephalic and atraumatic.   Neck:      Thyroid: No thyromegaly.      Vascular: No JVD.      Trachea: No tracheal deviation.   Cardiovascular:      Rate and Rhythm: Normal rate and regular rhythm.      Heart sounds: Normal heart sounds.   Pulmonary:      Effort: Pulmonary effort is normal. No respiratory distress.      Breath sounds: Normal breath sounds. No wheezing or rales.   Chest:      Chest wall: No tenderness.   Abdominal:      General: Bowel sounds are normal. There is no distension.      Palpations: Abdomen is soft. There is no mass.      Tenderness: There is no abdominal tenderness. There is no guarding or rebound.   Musculoskeletal:      Cervical back: Normal range of motion and neck supple.   Lymphadenopathy:      Cervical: No cervical adenopathy.   Skin:     General: Skin is warm and dry.   Neurological:      Mental Status: She is alert and oriented to person, place, and time.   Psychiatric:         Behavior: Behavior normal.         Thought Content: Thought content normal.         Judgment: Judgment normal.         Labwork reviewed:  Lab Results   Component Value Date/Time    HGBA1C 4.9 03/12/2009 02:16 PM    TSH 4.05 12/28/2020 10:39 AM    CHOL 274 (H) 12/28/2020 10:39 AM    TRIG 137 12/28/2020 10:39 AM    HDL 55 12/28/2020 10:39 AM    LDL 203 (H) 12/28/2020 10:39 AM    NA 141 12/28/2020 10:39 AM    K 4.0 12/28/2020 10:39 AM    CL 109 12/28/2020 10:39 AM    CO2 24 12/28/2020 10:39 AM    GAP 8 12/28/2020 10:39 AM    BUN 17 12/28/2020 10:39 AM    CR 1.10 (H) 12/28/2020 10:39 AM    GLU 81 12/28/2020 10:39 AM    GLU 116 (H) 09/07/2016 02:52 PM    CA 9.3 12/28/2020 10:39 AM    ALBUMIN 4.2 12/28/2020 10:39 AM    TOTPROT 7.1 12/28/2020 10:39 AM    ALKPHOS 74 12/28/2020 10:39 AM    AST 17 12/28/2020 10:39 AM    ALT 14 12/28/2020 10:39 AM    TOTBILI 0.4 12/28/2020 10:39 AM    GFR 40 (L) 11/25/2019 03:56 PM    GFRAA 49 (L) 11/25/2019 03:56 PM            Assessment and Plan:    Problem   Lung Nodule    CT chest ordered  03/29/2022         Hld (Hyperlipidemia)    Hepatic Function    Lab Results   Component Value Date/Time    ALBUMIN 4.2 12/28/2020 10:39 AM    TOTPROT 7.1 12/28/2020 10:39 AM    ALKPHOS 74 12/28/2020 10:39 AM    Lab Results   Component Value Date/Time    AST 17 12/28/2020 10:39 AM  ALT 14 12/28/2020 10:39 AM    TOTBILI 0.4 12/28/2020 10:39 AM        Lab Results   Component Value Date    CHOL 274 (H) 12/28/2020    TRIG 137 12/28/2020    HDL 55 12/28/2020    LDL 203 (H) 12/28/2020    VLDL 27 12/28/2020    NONHDLCHOL 219 12/28/2020    CHOLHDLC 3.3 09/07/2016       BP Readings from Last 3 Encounters:   03/29/22 99/77   07/21/21 114/68   12/28/20 133/84     66 y.o.   2013 ACC & AHA cholesterol guidelines:   The 10-year ASCVD risk score (Arnett DK, et al., 2019) is: 4.9%    Values used to calculate the score:      Age: 35 years      Sex: Female      Is Non-Hispanic African American: No      Diabetic: No      Tobacco smoker: No      Systolic Blood Pressure: 99 mmHg      Is BP treated: No      HDL Cholesterol: 55 MG/DL      Total Cholesterol: 274 MG/DL       Advised heart healthy diet and daily exercise.   12/28/2020  Inc to lipitor 40 qhs start.       Hot Flashes     11/15/2017  venlafaxine  Change to lexapro 10        Well Woman Exam    03/29/2022 physical done   08/2022 due for 12 mo CT chest       Cervical Cancer screening: only one time abnormal, colposcopy was normal.  Lung Cancer screening: history of lung cancer - released by Dr. Ladona Ridgel 2015     Last screened 11/15/2017 67 y.o., if >65y, will plan annual screening. Depression Screening:  Patient Scores:  PHQ-2: No data recorded    PHQ-9: No data recorded  Interventions:  PHQ-2: PHQ-2 Score less than 3: No follow-up or recommendations are necessary at this time (03/28/2022  3:51 PM)      Depression Interventions PHQ-2/9: Interventions: No follow-up or recommendations are necessary at this time (03/28/2022  3:51 PM)    Depression Screening was performed on Rolla Flatten in clinic today. Based on the score of (P) 0, no follow up action or recommendations are necessary at this time..  Will plan for annual screening.  BILLING 25 modifier, [phone], annually and medicare only   Creatinine clearance: CrCl cannot be calculated (Patient's most recent lab result is older than the maximum 30 days allowed.).  Health Maintenance Due:  Health Maintenance Due   Topic Date Due    MEDICARE ANNUAL WELLNESS VISIT  Never done    SHINGLES RECOMBINANT VACCINE (1 of 2) 10/30/2004    RSV VACCINE (60 YEARS AND OLDER) (1 - 1-dose 60+ series) Never done    BREAST CANCER SCREENING  12/27/2021    ADVANCE CARE PLANNING DISCUSSION AND DOCUMENTATION  12/28/2021      Health Maintenance completed:  Health Maintenance   Topic Date Due    MEDICARE ANNUAL WELLNESS VISIT  Never done    SHINGLES RECOMBINANT VACCINE (1 of 2) 10/30/2004    RSV VACCINE (60 YEARS AND OLDER) (1 - 1-dose 60+ series) Never done    BREAST CANCER SCREENING  12/27/2021    ADVANCE CARE PLANNING DISCUSSION AND DOCUMENTATION  12/28/2021    DTAP/TDAP VACCINES (3 - Td or Tdap) 11/16/2027  COLORECTAL CANCER SCREENING  07/22/2031    OSTEOPOROSIS SCREENING/MONITORING  Completed    COVID-19 VACCINE  Completed    PNEUMOCOCCAL VACCINE 65+ YRS  Completed    DEPRESSION SCREENING  Completed    INFLUENZA VACCINE  Completed    HEPATITIS C SCREENING  Addressed    CERVICAL CANCER SCREENING  Discontinued      Dexa:    DUE at age 63  Last Annual eye exam:  Due for next eye exam:  Hx glaucoma/ family hx mac degen; >37 y every 1-2 years  Last Dental exam:  Advised every 6 mo  HRT:  None    Annual lung Cancer screening w/ CT chest. denies tobacco hx.    Hearing difficulty?:  Yes, saw Audiology   Immunization History   Administered Date(s) Administered    COVID-19 (PFIZER), mRNA vacc, 30 mcg/0.3 mL (PF) 04/16/2019, 05/07/2019, 11/21/2019    COVID-19 Bivalent (77YR+)(PFIZER), mRNA vacc, 25mcg/0.3mL 12/28/2020    Covid-19 mRNA Vaccine >=12yo (Pfizer)(2023-24 Formula)(Comirnaty) 12/22/2021    Flu Vaccine =>6 Months Quadrivalent PF 12/04/2016, 11/15/2017    Flu Vaccine =>65 YO High-Dose Quadrivalent (PF) 11/21/2019    Flu Vaccine Cell Cult Quad 4+YO (PF)(Flucelvax) 11/25/2018    Flu Vaccine, Quadrivalent, Adjuvanted (Preservative Free) 12/23/2020, 10/26/2021    Flu vaccine, inj unspecified (Historical) 11/12/2018, 12/23/2020    Pneumococcal Vaccine (20-Val) 12/28/2020    Pneumococcal Vaccine (23-Val Adult) 11/25/2019    Td vaccine (>=7YO) PF IM (Decavac, Tenivac) 11/15/2017    Tdap Vaccine 02/07/2004    Zoster Vaccine, Unspecified (Historical) 09/27/2015         >20 y. Lipid panel if increased risk of CHD:  Lab Results   Component Value Date    CHOL 274 (H) 12/28/2020    TRIG 137 12/28/2020    HDL 55 12/28/2020    LDL 203 (H) 12/28/2020    VLDL 27 12/28/2020    NONHDLCHOL 219 12/28/2020    CHOLHDLC 3.3 09/07/2016       If HTN, screen for DM: cmp  If HLD, refer to nutrition for low cholesterol diet:  Screen for obesity:  Body mass index is 23.34 kg/m?Marland Kitchen       Overweight (BMI ?25)  is above average; referral written for nutritional counseling.               Lung Cancer (Hcc)        Patient was in her usual state of health until November, 2010 when she began having shoulder pain consistent with adhesive capsulitis. She had had adhesive capsulitis in the other shoulder and had to have it treated under anesthetic. She has had progressive pain which interferes with her sleep and activities. An xray was done showing a spiculated pulmonary nodule and she has undergone evaluation.  The nodule is 1.8 by 1.8 and is cavitary. It is in the RLL. She had a needle biopsy which is suggestive of a papillary adenocarcinoma. Her PET showed only the nodule.     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  Histologic Grade: G2: Moderately differentiated  Visceral Pleural Invasion: Not identified  Margins: (Vascular, Bronchial, Parenchymal)  Margins uninvolved by invasive carcinoma  Distance of invasive carcinoma  from closest margin: 3 cm  Specify margin: Bronchial margin.  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  01/25/12: no radiology or lab testing done, will repeat at next visit in 6 months. She is doing well, no new complaints  07/25/2012 CT stable. Exam WNL. She is asymptomatic. Will see in 6 months.   08/05/2013 Exam and labs are stable.  She is asymptomatic.  She no longer needs to be seen by oncology and can continue regular follow up with her PCP.      Mood Disorder (Hcc)        11/15/2017 dc effexor and switch to lexapro 10mg     - multiple divorces that were difficult; prior diagnoses of depression, anxiety  - has been on bupropion, never felt like it made a difference  - does not wish to be on any medications at this time specifically for depression/anxiety  - mood overall improved during visit today - patient less irritable and emotional  - patient reports being sexually active in a new relationship  - insomnia also improved                           Ample opportunity given for questions and answers.  Patient Instructions   Thank you for coming in today!  Our plan from this visit:  The lifestyle change that you have decided on today is:   Exercise.  Hydrate with at least 80 ounces (10 glasses or 2.4L or ) of water daily unless you are on a fluid restriction.   Follow low sodium diet, 2000 mg/day or less If you are having swelling.           For Body mass index is 23.34 kg/m?Marland Kitchen  and    Wt Readings from Last 1 Encounters:   03/29/22 67.6 kg (149 lb)    Your treatment plan includes a nutritionally balanced, reduced calorie diet, exercise and behavioral counseling.      Bone density test ordered to look for osteoporosis or osteopenia.  Due 11/22  Refills done 03/29/2022    Fasting labs in 12  months prior to appt.  exercise in am and stretching in evening and magnesium OTC.     CT chest in 6 months.  Call [place] [facility] to schedule at [phone], #2, then; General #1 or CT #3 OR Nuclear med #2 or [place] #4 or MRI #5.    Text MAMMO to [phone].   Please schedule  in mychart in preventative care or call [phone], option 3 for mammogram scheduling.  Or online scheduling at:  [email or web address]      Walk in appointments (no appointment necessary)  for routine mammograms as well as 3D mammograms are available at [facility] from 5 PM - 6:45 PM Monday through Thursday and from 8 AM to 2 PM on Saturdays.      Walk-in appointments are also available at our [facility] [employer] [facility] [employer] location, [address]. from 7 AM - 4PM Monday through Friday and at [facility], [address] from 8 AM-4PM Monday, Wednesday, Friday and 9 AM - 5 PM on Thursday.             Effective February 06, 2021 the shingles vaccine, Shingrix, is available for free to all Medicare beneficiaries.  The Shingrix vaccine is covered under your Medicare Part D prescription drug plan.   As of 2023 all Part D drug coverage must offer the Shingrix vaccine free of charge. It does not matter if you have a Original Medicare and a stand-alone Part D plan or a Part D plan that is bundled with your Medicare Advantage Plan. All are now required to offer the vaccine at no cost to the consumer. You can get the shingles vaccine with no out of pocket cost. Please take the printed prescription to an outside (not [place]) pharmacy,  where the pharmacists do vaccines.    Shingrix vaccine is 2 shots, 2 months apart- shingrix.com for more info.     The All of [place] Research Program is a historic effort to collect and study data from one million or more people living in the [place]. The goal of the program is better health for all of [place].   Our mission is to accelerate health research and medical breakthroughs, enabling individualized prevention, treatment, and care for all of [place]. This mission is carried out through three connected focus areas that are supported and made possible by a team that maintains a culture built around the program?s core values.    How Can All of [place] Make a Difference?  Too often, health care is one size fits all. Treatments meant for the ?average? patient may not work well for individual people. Health care providers may find it difficult to coordinate care among specialists or to access all of a patient?s health information. Researchers may spend lots of time and resources creating new databases for every study.    All of [place] is working to improve health care through research. Unlike research studies that focus on one disease or group of people, All of [place] is building a diverse database that can inform thousands of studies on a variety of health conditions. This creates more opportunities to:    Know the risk factors for certain diseases  Figure out which treatments work best for people of different backgrounds  Connect people with the right clinical studies for their needs  Learn how technologies can help [place] take steps to be healthier  Learn about what makes the All of [place] [employer] different.    All of [place] and Precision Medicine  The [employer] of Health formed the Precision Medicine Initiative Working Group of the [employer] to Catering manager in March 2015. The group concluded its work in September 2015 with a detailed report. The report provided a framework for setting up the All of [place] Research Program.    Precision medicine:    Is based on you as an individual  Takes into account your environment (where you live), lifestyle (what you do), and your family health history and genetic makeup  Gives health care providers the information they need to make customized recommendations for people of different backgrounds, ages, and regions  Helps you get better information about how to be healthier  Reduces health care costs by matching the right person with the right treatment the first time  All of [place] is part of a new era in which researchers, health care providers, Engineer, water, community partners, and the public work together to develop individualized health care. Learn more about who is involved.  Ready to join All of [place]? Visit JoinAllofUs.org to learn more.  What Makes the All of  [place] Research Program Different  Breadth. With a goal of enrolling one million or more participants in the [place], All of [place] is building one of the [employer] of its kind. As the amount of data grows, patterns will emerge that wouldn?t be visible at a smaller scale.     Diversity. The program is enrolling a large group of people that reflects the diversity of the [place]. This includes people who haven?t taken part in or have been left out of health research before. All of [place] welcomes participants of all backgrounds and walks of life, from all regions of the country, whether they are healthy or sick.     Depth. All of [place] collects many types of data, including data from surveys, electronic health records, and blood and urine tests. Over time, participants may share data in new ways, using wearable fitness trackers and other technologies. This will help researchers get a more complete picture of factors that affect health and disease.     Duration. The initial plan for the program spans 10 years, but it may last even longer. Working with participants over the long term means the program can gather more information that will help researchers find out how health and disease change over time.     Innovation. All of [place] is working to take research to a new level. The program is working with participants across the country, collecting many types of information over time, and building a database that many researchers can use. This new model could shape how people do research in the future. All of [place] will share lessons about what works well with other research programs around the world.     Access. All of [place] aims to make it easy for a variety of researchers--from university professors to citizen scientists--to make discoveries using the program?s data. Multiple systems and processes keep data secure and participants? personal information private.     Engagement. Participants are partners in All of [place]. Participant input is welcome on every aspect of the program to make it better. Participants will have full access to data they share and information about all research projects that use All of [place] data.    If you decide to join All of [place], we will ask you to share different kinds of information over time. We will ask you basic information like your name and where you live. We will ask you questions about your health, family, home, and work. If you have an electronic health record, we may ask for access. We may ask you to go to a local clinic or drug store for a free appointment with [place]. At this appointment, we would measure your weight, height, hips, and waist, as well as your blood pressure and heart rate. We might ask you to give samples, such as blood or urine, at the appointment.    You will be able to choose how frequently we contact you. From time to time, we may send you new surveys or offer other ways for you to share information about your health.    People join for many reasons. You might join to:    Learn more about your health, including your DNA. This will include, for example, information about genetic traits and ancestry, potential increased risk of developing certain diseases, or how your body might react to certain medicines.  Help improve the health of your communities and future generations.  Help researchers find the best ways for people to stay  healthy.  Help researchers one day create better tests and treatments.  The longer you stay involved with All of [place], the more you can learn about yourself and help speed up health research and medical breakthroughs.  Many patients do their labs a few days before their appointment with me and we will plan to discuss the results at that appointment.  But, as a part of the CARES act, starting 05/08/2019, some results will be released to mychart automatically.  I will continue to send a note on any labs that I order.  But, with these changes you may see your results before I do.  Critical lab results will be addressed immediately, but otherwise please  give me 72 hours to view and respond to your results before reaching out with questions.       We are in clinic on Mondays AM, Tuesdays AM,  Wednesday all day, Thursdays PM and Fridays AM.   If you have an urgent issue that you believe can be taken care of in our clinic by being overbooked on a Monday, Tuesday or Friday am, then Please call the nurse, Tresa Endo, at [phone] to be added to our schedule.  When overbooked for urgent issue, we will only have time to take care of the single urgent issue and cannot take care of multiple issues at that time.      Melinda Goodman was seen today for physical, uri and other.    Diagnoses and all orders for this visit:    Hyperlipidemia, unspecified hyperlipidemia type  -     COMPREHENSIVE METABOLIC PANEL; Future; Expected date: 03/30/2023  -     LIPID PROFILE; Future; Expected date: 03/30/2023  -     CBC; Future; Expected date: 03/30/2023  -     TSH WITH FREE T4 REFLEX; Future; Expected date: 03/30/2023    Chronic migraine without 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    Hot flashes    Malignant neoplasm of lower lobe of right lung (HCC)    Mood disorder (HCC)    Well woman exam  -     varicella-zoster gE-AS01B (PF) (SHINGRIX (PF)) 50 mcg/0.5 mL injection; Inject 0.5 mL into the muscle once for 1 dose. IM: 0.5 mL administered as a 2-dose series at 0 and 2 to 6 months  Fax verification of vaccination to [phone]  -     MAMMO SCREEN BILAT/TOMO; Future; Expected date: 03/29/2022  -     atorvastatin (LIPITOR) 40 mg tablet; Take one tablet by mouth at bedtime daily.  -     escitalopram oxalate (LEXAPRO) 20 mg tablet; Take one tablet by mouth daily.  -     SUMAtriptan succinate (IMITREX) 25 mg tablet; Take one tablet by mouth at onset of headache. May repeat after 2 hours if needed. Max of 200 mg in 24 hours.  -     topiramate (TOPAMAX) 100 mg tablet; Take one tablet by mouth twice daily.  -     COMPREHENSIVE METABOLIC PANEL; Future; Expected date: 03/30/2023  -     LIPID PROFILE; Future; Expected date: 03/30/2023  -     CBC; Future; Expected date: 03/30/2023  -     TSH WITH FREE T4 REFLEX; Future; Expected date: 03/30/2023    Breast cancer screening by mammogram  -     MAMMO SCREEN BILAT/TOMO; Future; Expected date: 03/29/2022  Lung nodule    Asymptomatic menopausal state  -     BONE DENSITY SPINE/HIP; Future; Expected date: 12/29/2022         You are due for these screening tests and/ or vaccines.  If you believe you have completed these items, please bring [place] these records or you can fax them to [phone].   Health Maintenance Due   Topic Date Due    MEDICARE ANNUAL WELLNESS VISIT  Never done    SHINGLES RECOMBINANT VACCINE (1 of 2) 10/30/2004    RSV VACCINE (60 YEARS AND OLDER) (1 - 1-dose 60+ series) Never done    BREAST CANCER SCREENING  12/27/2021    ADVANCE CARE PLANNING DISCUSSION AND DOCUMENTATION  12/28/2021        You have completed these vaccines:  Immunization History   Administered Date(s) Administered    COVID-19 (PFIZER), mRNA vacc, 30 mcg/0.3 mL (PF) 04/16/2019, 05/07/2019, 11/21/2019    COVID-19 Bivalent (54YR+)(PFIZER), mRNA vacc, 42mcg/0.3mL 12/28/2020    Covid-19 mRNA Vaccine >=12yo (Pfizer)(2023-24 Formula)(Comirnaty) 12/22/2021    Flu Vaccine =>6 Months Quadrivalent PF 12/04/2016, 11/15/2017    Flu Vaccine =>65 YO High-Dose Quadrivalent (PF) 11/21/2019    Flu Vaccine Cell Cult Quad 4+YO (PF)(Flucelvax) 11/25/2018    Flu Vaccine, Quadrivalent, Adjuvanted (Preservative Free) 12/23/2020, 10/26/2021    Flu vaccine, inj unspecified (Historical) 11/12/2018, 12/23/2020    Pneumococcal Vaccine (20-Val) 12/28/2020    Pneumococcal Vaccine (23-Val Adult) 11/25/2019    Td vaccine (>=7YO) PF IM (Decavac, Tenivac) 11/15/2017    Tdap Vaccine 02/07/2004    Zoster Vaccine, Unspecified (Historical) 09/27/2015        If you ever have emergency symptoms of chest pain, shortness of breath or uncontrolled or unexplained pain, please go to your closest emergency room.  You can give [place] an update after you have addressed any emergency.      Great to see you today!  Dr. Hollace Kinnier and the [facility], [facility]. [facility]'s direct number is [phone] (not answered outside business hours).  Scheduling is [phone]; our fax is [phone].       Return in about 1 year (around 03/30/2023) for provider spot in [facility] ONLY [facility] opening1st, next appt in clinic, physical.   Patient Counseling:  Melinda Goodman was seen today for physical, uri and other.    Diagnoses and all orders for this visit:    Hyperlipidemia, unspecified hyperlipidemia type  -     COMPREHENSIVE METABOLIC PANEL; Future; Expected date: 03/30/2023  -     LIPID PROFILE; Future; Expected date: 03/30/2023  -     CBC; Future; Expected date: 03/30/2023  -     TSH WITH FREE T4 REFLEX; Future; Expected date: 03/30/2023    Chronic migraine without 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    Hot flashes    Malignant neoplasm of lower lobe of right lung (HCC)    Mood disorder (HCC)    Well woman exam  -     varicella-zoster [id number] (PF) (SHINGRIX (PF)) 50 mcg/0.5 mL injection; Inject 0.5 mL into the muscle once for 1 dose. IM: 0.5 mL administered as a 2-dose series at 0 and 2 to 6 months  Fax verification of vaccination to [phone]  -     MAMMO SCREEN BILAT/TOMO; Future; Expected date: 03/29/2022  -     atorvastatin (LIPITOR) 40 mg tablet; Take one  tablet by mouth at bedtime daily.  -     escitalopram oxalate (LEXAPRO) 20 mg tablet; Take one tablet by mouth daily.  -     SUMAtriptan succinate (IMITREX) 25 mg tablet; Take one tablet by mouth at onset of headache. May repeat after 2 hours if needed. Max of 200 mg in 24 hours.  -     topiramate (TOPAMAX) 100 mg tablet; Take one tablet by mouth twice daily.  -     COMPREHENSIVE METABOLIC PANEL; Future; Expected date: 03/30/2023  -     LIPID PROFILE; Future; Expected date: 03/30/2023  -     CBC; Future; Expected date: 03/30/2023  -     TSH WITH FREE T4 REFLEX; Future; Expected date: 03/30/2023    Breast cancer screening by mammogram  -     MAMMO SCREEN BILAT/TOMO; Future; Expected date: 03/29/2022    Lung nodule    Asymptomatic menopausal state  -     BONE DENSITY SPINE/HIP; Future; Expected date: 12/29/2022    .

## 2022-03-30 DIAGNOSIS — Z780 Asymptomatic menopausal state: Secondary | ICD-10-CM

## 2022-04-11 ENCOUNTER — Encounter: Admit: 2022-04-11 | Discharge: 2022-04-11 | Payer: MEDICARE

## 2022-04-17 ENCOUNTER — Encounter: Admit: 2022-04-17 | Discharge: 2022-04-17 | Payer: MEDICARE

## 2022-04-17 ENCOUNTER — Ambulatory Visit: Admit: 2022-04-17 | Discharge: 2022-04-17 | Payer: MEDICARE

## 2022-04-17 DIAGNOSIS — M7711 Lateral epicondylitis, right elbow: Secondary | ICD-10-CM

## 2022-04-17 DIAGNOSIS — Z01419 Encounter for gynecological examination (general) (routine) without abnormal findings: Secondary | ICD-10-CM

## 2022-04-17 DIAGNOSIS — J302 Other seasonal allergic rhinitis: Secondary | ICD-10-CM

## 2022-04-17 DIAGNOSIS — Z1231 Encounter for screening mammogram for malignant neoplasm of breast: Secondary | ICD-10-CM

## 2022-04-17 DIAGNOSIS — M1990 Unspecified osteoarthritis, unspecified site: Secondary | ICD-10-CM

## 2022-04-17 DIAGNOSIS — F39 Unspecified mood [affective] disorder: Secondary | ICD-10-CM

## 2022-04-17 DIAGNOSIS — E785 Hyperlipidemia, unspecified: Secondary | ICD-10-CM

## 2022-04-17 DIAGNOSIS — C3490 Malignant neoplasm of unspecified part of unspecified bronchus or lung: Secondary | ICD-10-CM

## 2022-04-17 DIAGNOSIS — S62647D Nondisplaced fracture of proximal phalanx of left little finger, subsequent encounter for fracture with routine healing: Secondary | ICD-10-CM

## 2022-04-17 DIAGNOSIS — F419 Anxiety disorder, unspecified: Secondary | ICD-10-CM

## 2022-04-17 DIAGNOSIS — H902 Conductive hearing loss, unspecified: Secondary | ICD-10-CM

## 2022-04-17 DIAGNOSIS — B977 Papillomavirus as the cause of diseases classified elsewhere: Secondary | ICD-10-CM

## 2022-04-17 DIAGNOSIS — Z780 Asymptomatic menopausal state: Secondary | ICD-10-CM

## 2022-04-17 DIAGNOSIS — Z8669 Personal history of other diseases of the nervous system and sense organs: Secondary | ICD-10-CM

## 2022-04-17 DIAGNOSIS — N390 Urinary tract infection, site not specified: Secondary | ICD-10-CM

## 2022-04-17 DIAGNOSIS — G43909 Migraine, unspecified, not intractable, without status migrainosus: Secondary | ICD-10-CM

## 2022-04-17 DIAGNOSIS — R232 Flushing: Secondary | ICD-10-CM

## 2022-06-23 ENCOUNTER — Encounter: Admit: 2022-06-23 | Discharge: 2022-06-23 | Payer: MEDICARE

## 2022-06-23 ENCOUNTER — Ambulatory Visit: Admit: 2022-06-23 | Discharge: 2022-06-23 | Payer: MEDICARE

## 2022-06-23 DIAGNOSIS — B379 Candidiasis, unspecified: Secondary | ICD-10-CM

## 2022-06-23 DIAGNOSIS — J029 Acute pharyngitis, unspecified: Secondary | ICD-10-CM

## 2022-06-23 MED ORDER — AMOXICILLIN 500 MG PO CAP
500 mg | ORAL_CAPSULE | Freq: Two times a day (BID) | ORAL | 0 refills | 7.00000 days | Status: AC
Start: 2022-06-23 — End: ?

## 2022-06-23 MED ORDER — FLUCONAZOLE 150 MG PO TAB
150 mg | ORAL_TABLET | Freq: Once | ORAL | 0 refills | 3.00000 days | Status: AC
Start: 2022-06-23 — End: ?

## 2022-06-23 NOTE — Progress Notes
Date of Service: 06/23/2022    Melinda Goodman is a 67 y.o. female.  DOB: 1955/11/10  MRN: [id number]     Subjective:             Sore Throat   This is a new problem. The current episode started in the past 7 days. The problem has been gradually worsening. There has been no fever. The pain is at a severity of 8/10. The pain is moderate. Pertinent negatives include no abdominal pain, congestion, coughing, diarrhea, drooling, ear discharge, ear pain, headaches, plugged ear sensation, shortness of breath, stridor, swollen glands or vomiting.   Sore Throat (Pt states she had a sore throat for 4 days, it got better for a day & then came back with vengeance. She states it is painful to swallow, open her mouth wide & the side of her neck. She states her husband looked at it & says its red with white on the left side. )           Review of Systems   HENT:  Positive for sore throat. Negative for congestion, drooling, ear discharge and ear pain.    Respiratory:  Negative for cough, shortness of breath and stridor.    Gastrointestinal:  Negative for abdominal pain, diarrhea and vomiting.   Neurological:  Negative for headaches.       Objective:         ? amoxicillin (AMOXIL) 500 mg capsule Take one capsule by mouth every 12 hours for 10 days.   ? atorvastatin (LIPITOR) 40 mg tablet Take one tablet by mouth at bedtime daily.   ? escitalopram oxalate (LEXAPRO) 20 mg tablet Take one tablet by mouth daily.   ? fexofenadine(+) (ALLEGRA) 180 mg PO tablet Take one tablet by mouth every morning.   ? fluconazole (DIFLUCAN) 150 mg tablet Take one tablet by mouth once for 1 dose. Repeat by taking 1 tablet by mouth on the last day of antibiotic.   ? ibuprofen/diphenhydramine cit (ADVIL PM PO) Take  by mouth.   ? SUMAtriptan succinate (IMITREX) 25 mg tablet Take one tablet by mouth at onset of headache. May repeat after 2 hours if needed. Max of 200 mg in 24 hours.   ? topiramate (TOPAMAX) 100 mg tablet Take one tablet by mouth twice daily. There were no vitals filed for this visit.  There is no height or weight on file to calculate BMI.     Physical Exam  Constitutional:       General: She is not in acute distress.     Appearance: Normal appearance. She is well-developed. She is not diaphoretic.   HENT:      Head: Normocephalic and atraumatic.      Right Ear: Hearing and external ear normal.      Left Ear: Hearing and external ear normal.   Eyes:      Extraocular Movements: Extraocular movements intact.      Conjunctiva/sclera: Conjunctivae normal.   Pulmonary:      Effort: Pulmonary effort is normal.   Musculoskeletal:      Cervical back: Normal range of motion.   Neurological:      General: No focal deficit present.      Mental Status: She is alert and oriented to person, place, and time.   Psychiatric:         Mood and Affect: Mood normal.         Behavior: Behavior normal.  Assessment and Plan:  Melinda Goodman was seen today for sore throat.    Diagnoses and all orders for this visit:    Pharyngitis, unspecified etiology  -     amoxicillin (AMOXIL) 500 mg capsule; Take one capsule by mouth every 12 hours for 10 days.    Antibiotic-induced yeast infection  -     fluconazole (DIFLUCAN) 150 mg tablet; Take one tablet by mouth once for 1 dose. Repeat by taking 1 tablet by mouth on the last day of antibiotic.        Take the amoxicillin (antibiotic) as prescribed for 10 days. It will take 48-72 hours to start working, so you may feel a little worse before you start to feel better. Eating a yogurt per day such as Activia (or any with acidophilus) or taking probiotics(buy over the counter at the pharmacy) will decrease the possibility of nausea, diarrhea, and other stomach/gastrointestinal side effects.    Contagious until taking the antibiotics for 48 hours so no work/school during this time. Also should not return until fever free for 24 hours without medication, if fever present.    Replace toothbrush 48 hours after starting antibiotics.    If sore throat is not improving within 72 hours, reevaluation is necessary, as this can be a sign of abscess.    Take tylenol or ibuprofen as needed for fever or sore throat.    Rest and drink plenty of water.     Go to the emergency room, if you develop severe pain in throat, difficulty turning neck, temp over 102F that does not come down much with medication, trouble breathing, muffled voice, drooling, trouble talking, difficulty swallowing liquids/food, severe fatigue, chest pain, or any other concerning symptoms.

## 2022-09-18 ENCOUNTER — Encounter: Admit: 2022-09-18 | Discharge: 2022-09-18 | Payer: MEDICARE

## 2022-09-18 DIAGNOSIS — R911 Solitary pulmonary nodule: Secondary | ICD-10-CM

## 2022-09-18 DIAGNOSIS — C3431 Malignant neoplasm of lower lobe, right bronchus or lung: Secondary | ICD-10-CM

## 2022-09-18 NOTE — Telephone Encounter
I think the order is expired.    Future Order Information      Expected Expires      09/02/2022 (Approximate) 09/02/2022      Maybe we just need to reorder it.    Kirby Funk, LPN

## 2022-09-26 ENCOUNTER — Ambulatory Visit: Admit: 2022-09-26 | Discharge: 2022-09-27 | Payer: MEDICARE

## 2022-09-26 ENCOUNTER — Encounter: Admit: 2022-09-26 | Discharge: 2022-09-26 | Payer: MEDICARE

## 2022-09-26 DIAGNOSIS — C3431 Malignant neoplasm of lower lobe, right bronchus or lung: Secondary | ICD-10-CM

## 2022-09-26 DIAGNOSIS — J438 Other emphysema: Secondary | ICD-10-CM

## 2022-09-27 ENCOUNTER — Encounter: Admit: 2022-09-27 | Discharge: 2022-09-27 | Payer: MEDICARE

## 2022-09-27 DIAGNOSIS — R232 Flushing: Secondary | ICD-10-CM

## 2022-09-27 DIAGNOSIS — Z8669 Personal history of other diseases of the nervous system and sense organs: Secondary | ICD-10-CM

## 2022-09-27 DIAGNOSIS — H902 Conductive hearing loss, unspecified: Secondary | ICD-10-CM

## 2022-09-27 DIAGNOSIS — F39 Unspecified mood [affective] disorder: Secondary | ICD-10-CM

## 2022-09-27 DIAGNOSIS — F419 Anxiety disorder, unspecified: Secondary | ICD-10-CM

## 2022-09-27 DIAGNOSIS — M7711 Lateral epicondylitis, right elbow: Secondary | ICD-10-CM

## 2022-09-27 DIAGNOSIS — J302 Other seasonal allergic rhinitis: Secondary | ICD-10-CM

## 2022-09-27 DIAGNOSIS — G43909 Migraine, unspecified, not intractable, without status migrainosus: Secondary | ICD-10-CM

## 2022-09-27 DIAGNOSIS — R911 Solitary pulmonary nodule: Secondary | ICD-10-CM

## 2022-09-27 DIAGNOSIS — C3431 Malignant neoplasm of lower lobe, right bronchus or lung: Secondary | ICD-10-CM

## 2022-09-27 DIAGNOSIS — N390 Urinary tract infection, site not specified: Secondary | ICD-10-CM

## 2022-09-27 DIAGNOSIS — J438 Other emphysema: Secondary | ICD-10-CM

## 2022-09-27 DIAGNOSIS — M1990 Unspecified osteoarthritis, unspecified site: Secondary | ICD-10-CM

## 2022-09-27 DIAGNOSIS — Z780 Asymptomatic menopausal state: Secondary | ICD-10-CM

## 2022-09-27 DIAGNOSIS — S62647D Nondisplaced fracture of proximal phalanx of left little finger, subsequent encounter for fracture with routine healing: Secondary | ICD-10-CM

## 2022-09-27 DIAGNOSIS — C3490 Malignant neoplasm of unspecified part of unspecified bronchus or lung: Secondary | ICD-10-CM

## 2022-09-27 DIAGNOSIS — B977 Papillomavirus as the cause of diseases classified elsewhere: Secondary | ICD-10-CM

## 2022-09-27 DIAGNOSIS — E785 Hyperlipidemia, unspecified: Secondary | ICD-10-CM

## 2022-10-25 ENCOUNTER — Ambulatory Visit: Admit: 2022-10-25 | Discharge: 2022-10-25 | Payer: MEDICARE

## 2022-10-25 DIAGNOSIS — N390 Urinary tract infection, site not specified: Secondary | ICD-10-CM

## 2022-10-25 MED ORDER — PHENAZOPYRIDINE 200 MG PO TAB
200 mg | ORAL_TABLET | Freq: Three times a day (TID) | ORAL | 0 refills | Status: AC | PRN
Start: 2022-10-25 — End: ?

## 2022-10-25 MED ORDER — NITROFURANTOIN MONOHYD/M-CRYST 100 MG PO CAP
100 mg | ORAL_CAPSULE | Freq: Two times a day (BID) | ORAL | 0 refills | 7.00000 days | Status: AC
Start: 2022-10-25 — End: ?

## 2022-10-25 MED ORDER — FLUCONAZOLE 150 MG PO TAB
ORAL_TABLET | ORAL | 0 refills | 3.00000 days | Status: AC
Start: 2022-10-25 — End: ?

## 2022-10-25 NOTE — Patient Instructions
You were given a prescription for Macrobid to take twice a day for 7 days and Pyridium to take every 8 hours as needed for urinary symptoms.  Please make sure to drink plenty of water to help flush out the infection.  Follow up to an in person urgent care if your symptoms worsen, fail to improve after 72 hours on the antibiotics, or you have any other urgent/emergent concerns.    Please check MyChart for your results which may take up to 72 hours.   We will send a MyChart results message but attempt to call you if you still need to sign up for MyChart.   Information to sign up to MyChart is on your After Visit Summary (AVS)  The MyChart help number is [phone], and the nurse triage number is [phone].

## 2022-12-19 ENCOUNTER — Encounter: Admit: 2022-12-19 | Discharge: 2022-12-19 | Payer: MEDICARE

## 2022-12-19 ENCOUNTER — Ambulatory Visit: Admit: 2022-12-19 | Discharge: 2022-12-19 | Payer: MEDICARE

## 2022-12-19 DIAGNOSIS — N3000 Acute cystitis without hematuria: Secondary | ICD-10-CM

## 2022-12-19 DIAGNOSIS — N390 Urinary tract infection, site not specified: Secondary | ICD-10-CM

## 2022-12-19 DIAGNOSIS — 1 ERRONEOUS ENCOUNTER--DISREGARD: Secondary | ICD-10-CM

## 2022-12-19 MED ORDER — ESTRADIOL 0.01 % (0.1 MG/GRAM) VA CREA
Freq: Every evening | VAGINAL | 1 refills | 30.00000 days | Status: AC
Start: 2022-12-19 — End: ?

## 2022-12-19 MED ORDER — CEPHALEXIN 500 MG PO CAP
500 mg | ORAL_CAPSULE | ORAL | 0 refills | Status: AC
Start: 2022-12-19 — End: ?

## 2022-12-19 NOTE — Patient Instructions
Melinda Goodman,  It was nice to see you today. Thank you for coming into clinic.    Routine Clinic Information:  Please don't hesitate to call if you have any problems or questions. My nurse Meriam Sprague can be reached at [phone]  If she does not answer, please leave a voicemail as she is probably rooming other patients.You may also message [place] in MyChart.    For refills on medications, please have your pharmacy fax a refill authorization request form to our office at Fax  [phone]. Please allow at least 3 business days for refill requests.     For urgent issues after business hours/weekends/holidays call [phone] and request for the outpatient internal medicine physician to be paged    We offer same day appointments for your acute health concerns. These appointments are on a first come, first serve basis. Please call [phone] if you would like to make an appointment.         To schedule appointment with specialist and/or testing please lease call central scheduling number at [phone]    No follow-ups on file.    [name], MD  Internal Medicine    1. Acute cystitis without hematuria    2. Recurrent UTI        Orders Placed This Encounter    URINALYSIS, COMPLETE W/REFLEX TO CULTURE    AMB REFERRAL TO UROLOGY    estradioL (ESTRACE) 0.01 % (0.1 mg/g) vaginal cream    cephalexin (KEFLEX) 500 mg capsule

## 2022-12-19 NOTE — Progress Notes
Date of Service: 12/19/2022    Melinda Goodman is a 67 y.o. female. DOB: March 12, 1955   MRN#: [id number]    Subjective:     Chief Complaint   Patient presents with    Follow Up     UTI - urgency, burning       History of Present Illness  67 year old pleasant female presented to clinic today with concerns of recurrent UTI.  Patient reported that she woke up this morning with urgency and burning sensation.  She had issues with recurrent UTI and had 4 of them this month.  She reported that she had her last UTI earlier this month when she was at [place] was treated with strong antibiotic.  She reported that many years ago she had issues with recurrent UTI but the gotten better but recently she started having them again.  She denies any fever.  She has mild chills.  No nausea or vomiting or costophrenic angle pain/tenderness.       ROS  See history of present illness.  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    Lateral epicondylitis of right elbow 03/21/2017    started 11/2016, improved with PT 03/2017 coming back slightly  likes to golf and play tennis PLAN encouraged patient that she may need to consider repeat PT  she will consider this and let me know if she would like to pursue PT again     Lung cancer (HCC) 02/24/2009     Patient was in her usual state of health until November, 2010 when she began having shoulder pain consistent with adhesive capsulitis. She had had adhesive capsulitis in the other shoulder and had to have it treated under anesthetic. She has had progressive pain which interferes with her sleep and activities. An xray was done showing a spiculated pulmonary nodule and she has undergone evaluation.    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    Recurrent UTI 02/18/2016    - no urinary symptoms at this time, though this has been a problem in the past - encouraged patient to call if develops symptoms    Seasonal allergies     allegra qday     Surgical History:   Procedure Laterality Date    COLONOSCOPY  2013  COLONOSCOPY DIAGNOSTIC WITH SPECIMEN COLLECTION BY BRUSHING/ WASHING - FLEXIBLE N/A 07/21/2021    Performed by Samuel Jester, MD at [facility]    HX PNEUMONECTOMY      RLL    HX TONSILLECTOMY      SHOULDER SURGERY       Family History   Problem Relation Name Age of Onset    Cancer-Prostate Father      Hypertension Father      High Cholesterol Father      GI Problem Father          ulcers    Cancer-Breast Maternal Aunt      Cancer-Breast Maternal Grandmother      Diabetes Maternal Grandmother      Cancer-Breast Paternal Grandmother      Blindness Mother      Macular Degen Mother      Obesity Mother      Hypertension Mother      High Cholesterol Mother      Obesity Brother      Other Brother          GERD     Social History     Tobacco Use   Smoking Status Never   Smokeless Tobacco Never   Tobacco Comments    Patient expieremented in college Objective:      atorvastatin (LIPITOR) 40 mg tablet Take one tablet by mouth at bedtime daily.    cephalexin (KEFLEX) 500 mg capsule Take one capsule by mouth every 6 hours for 7 days.    escitalopram oxalate (LEXAPRO) 20 mg tablet Take one tablet by mouth daily.    estradioL (ESTRACE) 0.01 % (0.1 mg/g) vaginal cream Insert or Apply  to vaginal area at bedtime daily.    fexofenadine(+) (ALLEGRA) 180 mg PO tablet Take one tablet by mouth every morning.    fluconazole (DIFLUCAN) 150 mg tablet Take 1 pill and then repeat the dose in 72 hour if symptoms continue.    ibuprofen/diphenhydramine cit (ADVIL PM PO) Take  by mouth.    phenazopyridine (PYRIDIUM) 200 mg tablet Take one tablet by mouth three times daily as needed for Pain. Take after meals for up to 2 days.    SUMAtriptan succinate (IMITREX) 25 mg tablet Take one tablet by mouth at onset of headache. May repeat after 2 hours if needed. Max of 200 mg in 24 hours.    topiramate (TOPAMAX) 100 mg tablet Take one tablet by mouth twice daily.     Vitals:    12/19/22 1406   BP: 116/84   Pulse: 83   Temp: 37 ?C (98.6 ?F)   Resp: 16   SpO2: 99%   TempSrc: Oral   PainSc: Six   Weight: 65 kg (143 lb 6.4 oz)   Height: 170.2 cm (5' 7)     Body mass index is 22.46 kg/m?Marland Kitchen     Physical Exam  Constitutional:       Appearance: Normal appearance.   HENT:      Head: Normocephalic and atraumatic.   Cardiovascular:      Rate and Rhythm: Normal rate and regular rhythm.   Pulmonary:      Effort: Pulmonary effort is normal.      Breath sounds: Normal breath sounds.   Abdominal:      General: Abdomen is flat.      Tenderness: There is abdominal tenderness. There is no right CVA tenderness, left CVA tenderness, guarding or rebound.   Skin:  General: Skin is warm and dry.   Neurological:      General: No focal deficit present.      Mental Status: She is alert and oriented to person, place, and time.   Psychiatric:         Mood and Affect: Mood normal.         Behavior: Behavior normal.              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       Closed nondisplaced fracture of proximal phalanx of left little finger with routine healing 12/28/2020      12/28/2020 ortho ref  IMPRESSION  Findings/impression:  1. An oblique fracture is present involving the proximal phalanx of the fifth digit. There is no evidence of significant displacement of fracture fragments. There is no evidence of significant angulation at fracture site. Early callus formation is noted at the fracture site consistent with a subacute chronicity.  2. Prominent degenerative changes are present involving the fifth DIP joint. Mild degenerative changes are present involving the triscaphe and first CMC joints.      Anemia 11/21/2018          CBC w diff    Lab Results   Component Value Date/Time    WBC 4.5 11/25/2019 03:56 PM    RBC 4.11 11/25/2019 03:56 PM    HGB 12.6 11/25/2019 03:56 PM    HCT 37.1 11/25/2019 03:56 PM    MCV 90.4 11/25/2019 03:56 PM    MCH 30.8 11/25/2019 03:56 PM    MCHC 34.0 11/25/2019 03:56 PM    RDW 12.8 11/25/2019 03:56 PM    PLTCT 200 11/25/2019 03:56 PM    MPV 7.3 11/25/2019 03:56 PM    Lab Results   Component Value Date/Time    NEUT 67 11/18/2018 09:28 AM    ANC 3.18 11/18/2018 09:28 AM    LYMA 17 (L) 11/18/2018 09:28 AM    ALC 0.80 (L) 11/18/2018 09:28 AM    MONA 13 (H) 11/18/2018 09:28 AM    AMC 0.60 11/18/2018 09:28 AM    EOSA 2 11/18/2018 09:28 AM    AEC 0.10 11/18/2018 09:28 AM    BASA 1 11/18/2018 09:28 AM    ABC 0.03 11/18/2018 09:28 AM             HLD (hyperlipidemia)      Hepatic Function    Lab Results   Component Value Date/Time    ALBUMIN 4.2 12/28/2020 10:39 AM    TOTPROT 7.1 12/28/2020 10:39 AM    ALKPHOS 74 12/28/2020 10:39 AM    Lab Results   Component Value Date/Time    AST 17 12/28/2020 10:39 AM    ALT 14 12/28/2020 10:39 AM    TOTBILI 0.4 12/28/2020 10:39 AM        Lab Results   Component Value Date    CHOL 274 (H) 12/28/2020    TRIG 137 12/28/2020    HDL 55 12/28/2020    LDL 203 (H) 12/28/2020    VLDL 27  12/28/2020    NONHDLCHOL 219 12/28/2020    CHOLHDLC 3.3 09/07/2016       BP Readings from Last 3 Encounters:   03/29/22 99/77   07/21/21 114/68   12/28/20 133/84     66 y.o.   2013 ACC & AHA cholesterol guidelines:   The 10-year ASCVD risk score (Arnett DK, et al., 2019) is: 4.9%    Values used to calculate the score:      Age: 67 years      Sex: Female      Is Non-Hispanic African American: No      Diabetic: No      Tobacco smoker: No      Systolic Blood Pressure: 99 mmHg      Is BP treated: No      HDL Cholesterol: 55 MG/DL      Total Cholesterol: 274 MG/DL       Advised heart healthy diet and daily exercise.   12/28/2020  Inc to lipitor 40 qhs start.        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      Recurrent UTI 02/18/2016 - no urinary symptoms at this time, though this has been a problem in the past  - encouraged patient to call if develops symptoms      Conductive hearing loss 02/18/2016      - audiology referral      Well woman exam 02/17/2016     03/29/2022 physical done   09/27/2023 due for 12 mo CT chest       Cervical Cancer screening: only one time abnormal, colposcopy was normal.  Lung Cancer screening: history of lung cancer - released by Dr. Ladona Ridgel 2015     Last screened 11/15/2017 67 y.o., if >65y, will plan annual screening. Depression Screening:  Patient Scores:  PHQ-2: No data recorded    PHQ-9: No data recorded  Interventions:  PHQ-2: PHQ-2 Score less than 3: No follow-up or recommendations are necessary at this time (03/28/2022  3:51 PM)      Depression Interventions PHQ-2/9: Interventions: No follow-up or recommendations are necessary at this time (03/28/2022  3:51 PM)    Depression Screening was performed on Rolla Flatten in clinic today. Based on the score of (P) 0, no follow up action or recommendations are necessary at this time..  Will plan for annual screening.  BILLING 25 modifier, [id number], annually  and medicare only   Creatinine clearance: CrCl cannot be calculated (Patient's most recent lab result is older than the maximum 30 days allowed.).  Health Maintenance Due:  Health Maintenance Due   Topic Date Due    MEDICARE ANNUAL WELLNESS VISIT  Never done    SHINGLES RECOMBINANT VACCINE (1 of 2) 10/30/2004    RSV VACCINE (60 YEARS AND OLDER) (1 - 1-dose 60+ series) Never done    BREAST CANCER SCREENING  12/27/2021    ADVANCE CARE PLANNING DISCUSSION AND DOCUMENTATION  12/28/2021      Health Maintenance completed:  Health Maintenance   Topic Date Due    MEDICARE ANNUAL WELLNESS VISIT  Never done    SHINGLES RECOMBINANT VACCINE (1 of 2) 10/30/2004    RSV VACCINE (60 YEARS AND OLDER) (1 - 1-dose 60+ series) Never done    BREAST CANCER SCREENING  12/27/2021    ADVANCE CARE PLANNING DISCUSSION AND DOCUMENTATION 12/28/2021    DTAP/TDAP VACCINES (3 - Td or Tdap) 11/16/2027    COLORECTAL CANCER SCREENING  07/22/2031    OSTEOPOROSIS SCREENING/MONITORING  Completed    COVID-19 VACCINE  Completed    PNEUMOCOCCAL VACCINE 65+ YRS  Completed    DEPRESSION SCREENING  Completed    INFLUENZA VACCINE  Completed    HEPATITIS C SCREENING  Addressed    CERVICAL CANCER SCREENING  Discontinued      Dexa:    DUE at age 44  Last Annual eye exam:  Due for next eye exam:  Hx glaucoma/ family hx mac degen; >37 y every 1-2 years  Last Dental exam:  Advised every 6 mo  HRT:  None    Annual lung Cancer screening w/ CT chest. denies tobacco hx.    Hearing difficulty?:  Yes, saw Audiology   Immunization History   Administered Date(s) Administered    COVID-19 (PFIZER), mRNA vacc, 30 mcg/0.3 mL (PF) 04/16/2019, 05/07/2019, 11/21/2019    COVID-19 Bivalent (52YR+)(PFIZER), mRNA vacc, 3mcg/0.3mL 12/28/2020    Covid-19 mRNA Vaccine >=12yo (Pfizer)(2023-24 Formula)(Comirnaty) 12/22/2021    Flu Vaccine =>6 Months Quadrivalent PF 12/04/2016, 11/15/2017    Flu Vaccine =>65 YO High-Dose Quadrivalent (PF) 11/21/2019    Flu Vaccine Cell Cult Quad 4+YO (PF)(Flucelvax) 11/25/2018    Flu Vaccine, Quadrivalent, Adjuvanted (Preservative Free) 12/23/2020, 10/26/2021    Flu vaccine, inj unspecified (Historical) 11/12/2018, 12/23/2020    Pneumococcal Vaccine (20-Val) 12/28/2020    Pneumococcal Vaccine (23-Val Adult) 11/25/2019    Td vaccine (>=7YO) PF IM (Decavac, Tenivac) 11/15/2017    Tdap Vaccine 02/07/2004    Zoster Vaccine, Unspecified (Historical) 09/27/2015         >20 y. Lipid panel if increased risk of CHD:  Lab Results   Component Value Date    CHOL 274 (H) 12/28/2020    TRIG 137 12/28/2020    HDL 55 12/28/2020    LDL 203 (H) 12/28/2020    VLDL 27 12/28/2020    NONHDLCHOL 219 12/28/2020    CHOLHDLC 3.3 09/07/2016       If HTN, screen for DM: cmp  If HLD, refer to nutrition for low cholesterol diet:  Screen for obesity:  Body mass index is 23.34 kg/m?Marland Kitchen Overweight (BMI >=25)  is above average; referral written for nutritional counseling.                Lung cancer (HCC) 02/24/2009         Patient was in her usual state of health until November, 2010 when she began having shoulder pain  consistent with adhesive capsulitis. She had had adhesive capsulitis in the other shoulder and had to have it treated under anesthetic. She has had progressive pain which interferes with her sleep and activities. An xray was done showing a spiculated pulmonary nodule and she has undergone evaluation.  The nodule is 1.8 by 1.8 and is cavitary. It is in the RLL. She had a needle biopsy which is suggestive of a papillary adenocarcinoma. Her PET showed only the nodule.     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  Histologic Grade: G2: Moderately differentiated  Visceral Pleural Invasion: Not identified  Margins: (Vascular, Bronchial, Parenchymal)  Margins uninvolved by invasive carcinoma  Distance of invasive carcinoma from closest margin: 3 cm  Specify margin: Bronchial margin.  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  01/25/12: no radiology or lab testing done, will repeat at next visit in 6 months. She is doing well, no new complaints  07/25/2012 CT stable. Exam WNL. She is asymptomatic. Will see in 6 months.   08/05/2013 Exam and labs are stable. She is asymptomatic.  She no longer needs to be seen by oncology and can continue regular follow up with her PCP.       Mood disorder (HCC) 03/19/2006         11/15/2017 dc effexor and switch to lexapro 10mg     - multiple divorces that were difficult; prior diagnoses of depression, anxiety  - has been on bupropion, never felt like it made a difference  - does not wish to be on any medications at this time specifically for depression/anxiety  - mood overall improved during visit today - patient less irritable and emotional  - patient reports being sexually active in a new relationship  - insomnia also improved      Allergic rhinitis 03/19/2006      - perennial; well controlled on allegra - encouraged continued use  - summer time trigger: pollen  - winter time trigger: mold  - we discussed if symptoms worsen could always try nasal spray or eye drop       DJD (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      - chronic back pain and intermittent knee pain  - midline, lower back pain without radiation  - not currently on any medications  - former diver, 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 Acute cystitis without hematuria (Primary)    -  URINALYSIS, COMPLETE W/REFLEX TO CULTURE; Future  - cephalexin (KEFLEX) 500 mg capsule; Take one capsule by mouth every 6 hours for 7 days.  Dispense: 28 capsule; Refill: 0    2. Recurrent UTI    - AMB REFERRAL TO UROLOGY  - estradioL (ESTRACE) 0.01 % (0.1 mg/g) vaginal cream; Insert or Apply  to vaginal area at bedtime daily.  Dispense: 42.5 g; Refill: 1  - cephalexin (KEFLEX) 500 mg capsule; Take one capsule by mouth every 6 hours for 7 days.  Dispense: 28 capsule; Refill: 0      Return if symptoms worsen or fail to improve.

## 2022-12-25 ENCOUNTER — Encounter: Admit: 2022-12-25 | Discharge: 2022-12-25 | Payer: MEDICARE

## 2022-12-25 DIAGNOSIS — R8290 Unspecified abnormal findings in urine: Secondary | ICD-10-CM

## 2022-12-26 ENCOUNTER — Encounter: Admit: 2022-12-26 | Discharge: 2022-12-26 | Payer: MEDICARE

## 2022-12-27 ENCOUNTER — Encounter: Admit: 2022-12-27 | Discharge: 2022-12-27 | Payer: MEDICARE

## 2023-01-15 ENCOUNTER — Encounter: Admit: 2023-01-15 | Discharge: 2023-01-15 | Payer: MEDICARE

## 2023-01-15 ENCOUNTER — Ambulatory Visit: Admit: 2023-01-15 | Discharge: 2023-01-16 | Payer: MEDICARE

## 2023-01-17 ENCOUNTER — Ambulatory Visit: Admit: 2023-01-17 | Discharge: 2023-01-18 | Payer: MEDICARE

## 2023-01-17 ENCOUNTER — Encounter: Admit: 2023-01-17 | Discharge: 2023-01-17 | Payer: MEDICARE

## 2023-01-19 ENCOUNTER — Encounter: Admit: 2023-01-19 | Discharge: 2023-01-19 | Payer: MEDICARE

## 2023-01-24 ENCOUNTER — Encounter: Admit: 2023-01-24 | Discharge: 2023-01-24 | Payer: MEDICARE

## 2023-01-24 ENCOUNTER — Ambulatory Visit: Admit: 2023-01-24 | Discharge: 2023-01-24 | Payer: MEDICARE

## 2023-01-24 DIAGNOSIS — N390 Urinary tract infection, site not specified: Secondary | ICD-10-CM

## 2023-01-24 NOTE — Patient Instructions
 Thank you so much for visiting with our care team today. We look forward to seeing you again.     Your results from labs will be released to you directly in My Chart prior to [place] seeing them. If you have not heard from [place] within 2 weeks of your testing please reach out to our office or send [place] a MyChart message.     If you are needing medication refills, please use the MyChart Refill request or contact your pharmacy directly to request medication refills.  Please allow 72 hours.    MyChart: You may sign up for MyChart to receive test results, contact our team with questions, or have prescription requests filled.    Follow the instructions at the end of visit summary    You can call us at [phone] or send a MyChart message.

## 2023-01-24 NOTE — Progress Notes
Date of Service: 01/24/2023  MRN#: [id number]  DOB: 1955-06-22  PCP: Werner Lean    Subjective:   Chief Complaint   Patient presents with    Bladder infection     Has got better, was taking OTC medication.        Melinda Goodman is a 67 y.o.female patient who presents for ongoing UTI concerns        History of Present Illness      UTI concerns   Has significant history of recurrent UTIs   She was last treated 12/19/2022 with Dr. Remer Macho   At that time Urine culture did come back negative   She repeated another UA on 01/17/2023 that also had a negative culture   However complete UA does continue to show 1+ protein, 1+ blood, 3+ leukocytes, few yeast  She was taking OTC treatment: Azo which has resolved her symptoms   She reports she started this on 12/12  She reports she is having urgency, burning sensation  Denies vaginal itching, burning, and discharge   Denies fevers or chills  Denies abdominal pain and flank pain   She will be seeing urology 03/29/2023   She was unable to start estradiol cream due to costs   She has been drinking 64+ ounces of water   She wears cotton underwear   She has only had one sexual partner in the last 1.5 years and does not feel like she needs STD screening         Past Medical History:    Closed nondisplaced fracture of proximal phalanx of left little finger with routine healing    Conductive hearing loss    DJD (degenerative joint disease)    History of glaucoma    HLD (hyperlipidemia)    Hot flashes    Human papilloma virus infection    Insomnia secondary to anxiety    Lateral epicondylitis of right elbow    Lung cancer (HCC)    Menopause    Migraine headache    Mood disorder (HCC)    Non-small cell lung cancer (HCC)    Other emphysema (HCC)    Recurrent UTI    Seasonal allergies       Review of Systems  Per HPI     Objective:     Medication:   atorvastatin (LIPITOR) 40 mg tablet Take one tablet by mouth at bedtime daily.    escitalopram oxalate (LEXAPRO) 20 mg tablet Take one tablet by mouth daily.    fexofenadine(+) (ALLEGRA) 180 mg PO tablet Take one tablet by mouth every morning.    ibuprofen/diphenhydramine cit (ADVIL PM PO) Take  by mouth.    SUMAtriptan succinate (IMITREX) 25 mg tablet Take one tablet by mouth at onset of headache. May repeat after 2 hours if needed. Max of 200 mg in 24 hours.    topiramate (TOPAMAX) 100 mg tablet Take one tablet by mouth twice daily.       Vitals:    01/24/23 1638   BP: 112/78   BP Source: Arm, Left Upper   Pulse: 82   Temp: 36.5 ?C (97.7 ?F)   Resp: 16   SpO2: 97%   TempSrc: Oral   PainSc: Zero   Weight: 63.5 kg (140 lb)   Height: 170.2 cm (5' 7)       Body mass index is 21.93 kg/m?Marland Kitchen      Physical Exam    GEN: awake, alert, NAD  HEENT: NCAT, EOMI, PERRL, nares patent, OP  clear  CV: RRR no m/r/g  LUNGS: CTA B, no r/r/w  ABD: BS+, soft, NT, ND  BACK: negative CVA tenderness bilaterally   EXT: no obvious deformity noted, FROM x4  NEURO: CN 2-12 intact, no focal deficits  SKIN: warm, dry, no lesions noted    Chaperone present for exam     Pelvic Exam  External genitalia: Without erythema, exudate, discharge, or obvious deformity.   Vagina: Rugated pink vaginal walls. No lesions noted. Vaginal fault without discharge.   Cervix: Cervical squamocolumnar junction identified on exam. No obvious deformities identified. No erythema, discharge, or lesions to be noted. No cervical motion tenderness noted   Uterus: Normal in size and non-tender.   Adnexa: no masses palpated, non-tender bilaterally          Assessment and Plan:  1. Recurrent UTI (Primary)  Continues to have ongoing UTI symptoms   Last two urine cultures have been negative  Does have resolution of symptoms with Azo   Will obtain new UA today with culture   If blood is still present, will offer CT for further imaging   Did complete vaginal exam today which is was unrevealing. Did complete vaginal swab to r/o BV and yeast   Denies need for STD screening  Encouraged adequate water intake   Encouraged proper hygiene, cotton underwear, and urinating after sexual intercourse   Did discuss that she could add in cranberry pills   Keep scheduled urology appointment and PCP appointment     - URINALYSIS, COMPLETE; Future  - CULTURE-URINE W/SENSITIVITY; Future    2. Dysuria  As above     - URINALYSIS, COMPLETE; Future  - CULTURE-URINE W/SENSITIVITY; Future    3. Vaginal irritation  As above     - DIRECT EXAM (WET PREP)      Orders Placed This Encounter    CULTURE-URINE W/SENSITIVITY    DIRECT EXAM (WET PREP)  today    URINALYSIS, COMPLETE       Total of 30 minutes were spent on the same day of the visit including preparing to see the patient, obtaining and/or reviewing separately obtained history, performing a medically appropriate examination and/or evaluation, counseling and educating the patient/family/caregiver, ordering medications, tests, or procedures, referring and communication with other health care professionals, documenting clinical information in the electronic or other health record, independently interpreting results and communicating results to the patient/family/caregiver, and care coordination.   Visit Disposition       Dispositions    Return for Schedule AWV with PCP.            Future Appointments   Date Time Provider Department Center   03/29/2023  1:00 PM Sherron Monday, MD [facility] Urology   04/04/2023 12:50 PM Werner Lean, MD [facility] IM     Patient Instructions   Thank you so much for visiting with our care team today. We look forward to seeing you again.     Your results from labs will be released to you directly in My Chart prior to [place] seeing them. If you have not heard from [place] within 2 weeks of your testing please reach out to our office or send [place] a MyChart message.     If you are needing medication refills, please use the MyChart Refill request or contact your pharmacy directly to request medication refills.  Please allow 72 hours.    MyChart: You may sign up for MyChart to receive test results, contact our team with questions, or have prescription  requests filled.    Follow the instructions at the end of visit summary    You can call us at [phone] or send a MyChart message.     Lowanda Foster, APRN-NP

## 2023-01-25 ENCOUNTER — Encounter: Admit: 2023-01-25 | Discharge: 2023-01-25 | Payer: MEDICARE

## 2023-01-25 DIAGNOSIS — R300 Dysuria: Secondary | ICD-10-CM

## 2023-01-25 DIAGNOSIS — N898 Other specified noninflammatory disorders of vagina: Secondary | ICD-10-CM

## 2023-01-29 ENCOUNTER — Encounter: Admit: 2023-01-29 | Discharge: 2023-01-29 | Payer: MEDICARE

## 2023-02-01 ENCOUNTER — Encounter: Admit: 2023-02-01 | Discharge: 2023-02-01 | Payer: MEDICARE

## 2023-02-01 NOTE — Telephone Encounter
Called patient to ask for more info on CT and wether she wanted to pursue this, NO answer, voicemail left

## 2023-02-06 ENCOUNTER — Encounter: Admit: 2023-02-06 | Discharge: 2023-02-06 | Payer: MEDICARE

## 2023-02-06 DIAGNOSIS — N390 Urinary tract infection, site not specified: Secondary | ICD-10-CM

## 2023-02-13 ENCOUNTER — Encounter: Admit: 2023-02-13 | Discharge: 2023-02-13 | Payer: MEDICARE

## 2023-02-13 ENCOUNTER — Ambulatory Visit: Admit: 2023-02-13 | Discharge: 2023-02-14 | Payer: MEDICARE

## 2023-02-13 DIAGNOSIS — J438 Other emphysema: Secondary | ICD-10-CM

## 2023-02-13 DIAGNOSIS — C3431 Malignant neoplasm of lower lobe, right bronchus or lung: Secondary | ICD-10-CM

## 2023-02-13 DIAGNOSIS — Z01419 Encounter for gynecological examination (general) (routine) without abnormal findings: Secondary | ICD-10-CM

## 2023-02-13 DIAGNOSIS — J441 Chronic obstructive pulmonary disease with (acute) exacerbation: Secondary | ICD-10-CM

## 2023-02-13 MED ORDER — PROMETHAZINE-DM 6.25-15 MG/5 ML PO SYRP
5 mL | ORAL | 0 refills | 7.00000 days | Status: AC | PRN
Start: 2023-02-13 — End: ?

## 2023-02-13 MED ORDER — PREDNISONE 20 MG PO TAB
40 mg | ORAL_TABLET | Freq: Every day | ORAL | 0 refills | Status: AC
Start: 2023-02-13 — End: ?

## 2023-02-13 MED ORDER — DOXYCYCLINE HYCLATE 100 MG PO TAB
100 mg | ORAL_TABLET | Freq: Two times a day (BID) | ORAL | 0 refills | 8.00000 days | Status: AC
Start: 2023-02-13 — End: ?

## 2023-02-13 NOTE — Patient Instructions
Thank you for coming in today!  Our plan from this visit:  The lifestyle change that you have decided on today is:   Exercise.  Hydrate with at least 80 ounces (10 glasses or 2.4L or ) of water daily unless you are on a fluid restriction.   Follow low sodium diet, 2000 mg/day or less If you are having swelling.           For There is no height or weight on file to calculate BMI.  and    Wt Readings from Last 1 Encounters:   01/24/23 63.5 kg (140 lb)    Your treatment plan includes a nutritionally balanced, reduced calorie diet, exercise and behavioral counseling.      Prednisone and doxycycline for emphysema exacerbation  Cough syrup     To join this study, they are calling patients in August/ 2024 or go to [employer]  The [employer] [place] Research Program is a historic effort to collect and study data from one million or more people living in the [place]. The goal of the program is better health for all of [place].   Our mission is to accelerate health research and medical breakthroughs, enabling individualized prevention, treatment, and care for all of [place]. This mission is carried out through three connected focus areas that are supported and made possible by a team that maintains a culture built around the program?s core values.    How Can [facility] of [place] Make a Difference?  Too often, health care is one size fits all. Treatments meant for the ?average? patient may not work well for individual people. Health care providers may find it difficult to coordinate care among specialists or to access all of a patient?s health information. Researchers may spend lots of time and resources creating new databases for every study.    [facility] [place] is working to improve health care through research. Unlike research studies that focus on one disease or group of people, [facility] [place] is building a diverse database that can inform thousands of studies on a variety of health conditions. This creates more opportunities to:    Know the risk factors for certain diseases  Figure out which treatments work best for people of different backgrounds  Connect people with the right clinical studies for their needs  Learn how technologies can help [place] take steps to be healthier  Learn about what makes the [employer] [place] [employer] different.    [place] of [place] and Precision Medicine  The [employer] of Health formed the Precision Medicine Initiative Working Group of the [employer] to Catering manager in March 2015. The group concluded its work in September 2015 with a detailed report. The report provided a framework for setting up the All of [place] Research Program.    Precision medicine:    Is based on you as an individual  Takes into account your environment (where you live), lifestyle (what you do), and your family health history and genetic makeup  Gives health care providers the information they need to make customized recommendations for people of different backgrounds, ages, and regions  Helps you get better information about how to be healthier  Reduces health care costs by matching the right person with the right treatment the first time  [place] of [place] is part of a new era in which researchers, health care providers, Engineer, water, community partners, and the public work together to develop individualized health care. Learn more about who is involved.  Ready to join All of [place]? Visit JoinAllofUs.org to learn more.  What Makes the All of [place] Research Program Different  Breadth. With a goal of enrolling one million or more participants in the [place], [facility] [place] is building one of the [employer] of its kind. As the amount of data grows, patterns will emerge that wouldn?t be visible at a smaller scale.     Diversity. The program is enrolling a large group of people that reflects the diversity of the [place]. This includes people who haven?t taken part in or have been left out of health research before. All of [place] welcomes participants of all backgrounds and walks of life, from all regions of the country, whether they are healthy or sick.     Depth. All of [place] collects many types of data, including data from surveys, electronic health records, and blood and urine tests. Over time, participants may share data in new ways, using wearable fitness trackers and other technologies. This will help researchers get a more complete picture of factors that affect health and disease.     Duration. The initial plan for the program spans 10 years, but it may last even longer. Working with participants over the long term means the program can gather more information that will help researchers find out how health and disease change over time.     Innovation. [facility] of [place] is working to take research to a new level. The program is working with participants across the country, collecting many types of information over time, and building a database that many researchers can use. This new model could shape how people do research in the future. [facility] [place] will share lessons about what works well with other research programs around the world.     Access. [facility] [place] aims to make it easy for a variety of researchers--from university professors to citizen scientists--to make discoveries using the program?s data. Multiple systems and processes keep data secure and participants? personal information private.     Engagement. Participants are partners in [facility] of [place]. Participant input is welcome on every aspect of the program to make it better. Participants will have full access to data they share and information about all research projects that use [facility] [place] data.    If you decide to join [facility] [place], we will ask you to share different kinds of information over time. We will ask you basic information like your name and where you live. We will ask you questions about your health, family, home, and work. If you have an electronic health record, we may ask for access. We may ask you to go to a local clinic or drug store for a free appointment with [place]. At this appointment, we would measure your weight, height, hips, and waist, as well as your blood pressure and heart rate. We might ask you to give samples, such as blood or urine, at the appointment.    You will be able to choose how frequently we contact you. From time to time, we may send you new surveys or offer other ways for you to share information about your health.    People join for many reasons. You might join to:    Learn more about your health, including your DNA. This will include, for example, information about genetic traits and ancestry, potential increased risk of developing certain diseases, or how your body might react to certain medicines.  Help improve the health  of your communities and future generations.  Help researchers find the best ways for people to stay healthy.  Help researchers one day create better tests and treatments.  The longer you stay involved with [facility] [place], the more you can learn about yourself and help speed up health research and medical breakthroughs.      Many patients do their labs a few days before their appointment with me and we will plan to discuss the results at that appointment.  But, as a part of the CARES act, starting 05/08/2019, some results will be released to mychart automatically.  I will continue to send a note on any labs that I order.  But, with these changes you may see your results before I do.  Critical lab results will be addressed immediately, but otherwise please  give me 72 hours to view and respond to your results before reaching out with questions.       We are in clinic on Mondays AM, Tuesdays AM,  Wednesday all day, Thursdays PM and Fridays AM.   If you have an urgent issue that you believe can be taken care of in our clinic by being overbooked on a Monday, Tuesday or Friday am, then Please call the nurse, Tresa Endo, at [phone] to be added to our schedule.  When overbooked for urgent issue, we will only have time to take care of the single urgent issue and cannot take care of multiple issues at that time.      Melinda Goodman was seen today for cough.    Diagnoses and all orders for this visit:    Well woman exam    Malignant neoplasm of lower lobe of right lung (HCC)    Other emphysema (HCC)    COPD exacerbation (HCC)    Other orders  -     predniSONE (DELTASONE) 20 mg tablet; Take two tablets by mouth daily for 5 days.  -     doxycycline hyclate (VIBRACIN) 100 mg tablet; Take one tablet by mouth twice daily for 7 days.  -     promethazine-dextromethorphan (PHENERGAN DM) 6.25-15 mg/5 mL syrup; Take 5 mL by mouth every 4 hours as needed for Cough.         You are due for these screening tests and/ or vaccines.  If you believe you have completed these items, please bring [place] these records or you can fax them to [phone].   Health Maintenance Due   Topic Date Due    MEDICARE ANNUAL WELLNESS VISIT  Never done    SHINGLES RECOMBINANT VACCINE (1 of 2) 10/30/2004    RSV VACCINE (60 YEARS AND OLDER) (1 - Risk 60-74 years 1-dose series) Never done    ADVANCE CARE PLANNING DISCUSSION AND DOCUMENTATION  12/28/2021    INFLUENZA VACCINE (1) 09/07/2022    COVID-19 VACCINE (6 - 2024-25 season) 10/08/2022        You have completed these vaccines:  Immunization History   Administered Date(s) Administered    COVID-19 (PFIZER), mRNA vacc, 30 mcg/0.3 mL (PF) 04/16/2019, 05/07/2019, 11/21/2019    COVID-19 Bivalent (81YR+)(PFIZER), mRNA vacc, 11mcg/0.3mL 12/28/2020    Covid-19 mRNA Vaccine >=12yo (Pfizer)(Comirnaty) 12/22/2021    Flu Vaccine =>6 Months Quadrivalent PF 12/04/2016, 11/15/2017    Flu Vaccine =>65 YO High-Dose Quadrivalent (PF) 11/21/2019    Flu Vaccine Cell Cult Quad 4+YO (PF)(Flucelvax) 11/25/2018    Flu Vaccine, Quadrivalent, Adjuvanted (Preservative Free) 12/23/2020, 10/26/2021    Flu vaccine, inj unspecified (Historical) 11/12/2018, 12/23/2020  Pneumococcal Vaccine (20-Val) 12/28/2020    Pneumococcal Vaccine (23-Val Adult) 11/25/2019    Td vaccine (>=7YO) PF IM (Decavac, Tenivac) 11/15/2017    Tdap Vaccine 02/07/2004    Zoster Vaccine, Unspecified (Historical) 09/27/2015        If you ever have emergency symptoms of chest pain, shortness of breath or uncontrolled or unexplained pain, please go to your closest emergency room.  You can give [place] an update after you have addressed any emergency.      Great to see you today!  Dr. Hollace Kinnier and the General Internal [facility], [facility]. Kelly's direct number is [phone] (not answered outside business hours).  Scheduling is [phone]; our fax is [phone].

## 2023-02-13 NOTE — Progress Notes
Patient Reported Other  What topic(s) would you like to cover during your appointment?:  Coughing and mucus from cough going on 7 days  Please describe the issue(s) and history with the issue (location, severity, duration, symptoms, etc.).:  Cold with cough and now mucus over the last 7 days cant sleep  What has been done so far to take care of the issue(s)?:  Over the counter meds sever cold formula both night and day  What are your goals for this visit?:  Maybe get some drugs for the cough? And infection in chest?    Melinda Goodman is a 68 y.o. female.    The patient, with a history of lung cancer and mild emphysema, presents with a seven-day history of cold-like symptoms. They report a new onset of wheezing, noticeable on deep breaths, but not with every breath. They have also started to cough up large clumps of green mucus. They deny any shortness of breath.    The patient has a remote history of smoking during college, but no significant smoking history. They mention a family history of lung disease, with a brother having advanced lung disease with a heavy smoking history.    The patient's symptoms have been disrupting their sleep, requiring them to wake up in the middle of the night to take medication. They report that the symptoms return as soon as the medication wears off after four hours. They deny any medication allergies.     Patient denies fever.            Computed FIB-4 Calculation unavailable. One or more values for this score either were not found within the given timeframe or did not fit some other criterion.    <1.45 had a negative predictive value of 90% for advanced fibrosis  FIB-4 >3.25 would have a 97% specificity and a positive predictive value of 65% for advanced fibrosis.     No results found for: LIPOPROTA      The 10-year ASCVD risk score (Arnett DK, et al., 2019) is: 6.7%    Values used to calculate the score:      Age: 61 years      Sex: Female      Is Non-Hispanic African American: No      Diabetic: No      Tobacco smoker: No      Systolic Blood Pressure: 112 mmHg      Is BP treated: No      HDL Cholesterol: 55 MG/DL      Total Cholesterol: 274 MG/DL   Primary prevention Aspirin recommendation:   Adults aged 95 to 40 years with a 10% or greater 10-year cardiovascular disease (CVD) risk.   Adults 60 years or older The USPSTF recommends against initiating low-dose aspirin use for the primary prevention of CVD in adults 60 years or older.    RDW >14% indicated CVD risk:  RDW   Date Value Ref Range Status   12/28/2020 13.5 11 - 15 % Final   11/25/2019 12.8 11 - 15 % Final   11/18/2018 13.6 11 - 15 % Final   08/05/2013 13.2 11 - 15 % Final   07/25/2012 12.9 11 - 15 % Final   07/26/2011 13.1 11 - 15 % Final   01/17/2011 12.7 11 - 15 % Final   01/12/2010 12.6 11 - 15 % Final   03/20/2009 12.4 11 - 15 % Final   03/18/2009 12.5 11 - 15 % Final  Kidney failure risk equation:  Computed KFRE 5-Year unavailable. One or more values for this score either were not found within the given timeframe or did not fit some other criterion.     Depression Screening:  Patient Scores:  PHQ-2: PHQ-2 Score: (Patient-Rptd) 0 (02/12/2023 10:53 PM)   PHQ-9: No data recorded  Interventions:  PHQ-2: PHQ-2 Score less than 3: No follow-up or recommendations are necessary at this time (03/28/2022  3:51 PM)   Depression Interventions PHQ-2/9: Interventions: No follow-up or recommendations are necessary at this time      Health Maintenance Due   Topic Date Due    MEDICARE ANNUAL WELLNESS VISIT  Never done    SHINGLES RECOMBINANT VACCINE (1 of 2) 10/30/2004    RSV VACCINE (60 YEARS AND OLDER) (1 - Risk 60-74 years 1-dose series) Never done    ADVANCE CARE PLANNING DISCUSSION AND DOCUMENTATION  12/28/2021    INFLUENZA VACCINE (1) 09/07/2022    COVID-19 VACCINE (6 - 2024-25 season) 10/08/2022      Future Appointments   Date Time Provider Department Center   02/19/2023  9:00 AM CT - IC IC1CT [facility] Radiolog   03/29/2023  1:00 PM Sherron Monday, MD [facility]   04/04/2023 12:50 PM Werner Lean, MD [facility]                 Objective:          atorvastatin (LIPITOR) 40 mg tablet Take one tablet by mouth at bedtime daily.    doxycycline hyclate (VIBRACIN) 100 mg tablet Take one tablet by mouth twice daily for 7 days.    escitalopram oxalate (LEXAPRO) 20 mg tablet TAKE 1 TABLET BY MOUTH DAILY    fexofenadine(+) (ALLEGRA) 180 mg PO tablet Take one tablet by mouth every morning.    ibuprofen/diphenhydramine cit (ADVIL PM PO) Take  by mouth.    predniSONE (DELTASONE) 20 mg tablet Take two tablets by mouth daily for 5 days.    promethazine-dextromethorphan (PHENERGAN DM) 6.25-15 mg/5 mL syrup Take 5 mL by mouth every 4 hours as needed for Cough.    SUMAtriptan succinate (IMITREX) 25 mg tablet Take one tablet by mouth at onset of headache. May repeat after 2 hours if needed. Max of 200 mg in 24 hours.    topiramate (TOPAMAX) 100 mg tablet TAKE 1 TABLET BY MOUTH TWICE  DAILY     There were no vitals filed for this visit.  There were no vitals filed for this visit.    There is no height or weight on file to calculate BMI.     Physical Exam  Vitals signs and nursing note reviewed.   Constitutional:       Appearance:   is well-developed.   HENT:      Head: Normocephalic and atraumatic.   Neck:      Musculoskeletal: Normal range of motion.   Pulmonary:      Effort: Pulmonary effort is normal.   Skin:     General: Skin is warm and dry.   Neurological:      Mental Status:   is alert and oriented to person, place, and time.   Psychiatric:         Behavior: Behavior normal.         Thought Content: Thought content normal.         Judgment: Judgment normal.    Labwork reviewed:  Lab Results   Component Value Date/Time    HGBA1C 4.9 03/12/2009 02:16  PM    TSH 4.05 12/28/2020 10:39 AM    CHOL 274 (H) 12/28/2020 10:39 AM    TRIG 137 12/28/2020 10:39 AM    HDL 55 12/28/2020 10:39 AM    LDL 203 (H) 12/28/2020 10:39 AM    NA 141 12/28/2020 10:39 AM    K 4.0 12/28/2020 10:39 AM    CL 109 12/28/2020 10:39 AM    CO2 24 12/28/2020 10:39 AM    GAP 8 12/28/2020 10:39 AM    BUN 17 12/28/2020 10:39 AM    CR 1.10 (H) 12/28/2020 10:39 AM    GLU 81 12/28/2020 10:39 AM    CA 9.3 12/28/2020 10:39 AM    ALBUMIN 4.2 12/28/2020 10:39 AM    TOTPROT 7.1 12/28/2020 10:39 AM    ALKPHOS 74 12/28/2020 10:39 AM    AST 17 12/28/2020 10:39 AM    ALT 14 12/28/2020 10:39 AM    TOTBILI 0.4 12/28/2020 10:39 AM    GFR 40 (L) 11/25/2019 03:56 PM    GFRAA 49 (L) 11/25/2019 03:56 PM            Assessment and Plan:      Emphysema Exacerbation  Presents with a 7-day history of cold symptoms progressing to wheezing on deep breaths and productive cough with green mucus. No fever reported. History of lung cancer and emphysema noted on previous CT scan. Mild exacerbation. Discussed risks and benefits of prednisone (potential increased appetite, insomnia) and doxycycline (antibiotic).  - Prescribe doxycycline  - Prescribe prednisone (take in the morning to avoid insomnia)  - Prescribe promethazine with dextromethorphan cough syrup  - Send prescriptions to [facility] in [facility]    Lung Cancer  No recent issues or symptoms. No current treatments.  - Monitor for any new symptoms or changes    General Health Maintenance  Scheduled pelvic CT scan on Monday and follow-up appointment in February after returning from [place].  - Complete pelvic CT scan on Monday  - Attend follow-up appointment in February.          Problem   Copd Exacerbation (Hcc)    02/13/2023 doxy and prednisone.   No Known Allergies      Other Emphysema (Hcc)     09/27/2022 seen on CT, no current s/s or treatments.   Smoked in [place]     Well Woman Exam    03/29/2022 physical done   09/27/2023 due for 12 mo CT chest       Cervical Cancer screening: only one time abnormal, colposcopy was normal.  Lung Cancer screening: history of lung cancer - released by Dr. Ladona Ridgel 2015     Last screened 11/15/2017 68 y.o., if >65y, will plan annual screening. Depression Screening:  Patient Scores:  PHQ-2: PHQ-2 Score: (Patient-Rptd) 0 (02/12/2023 10:53 PM)      PHQ-9: No data recorded  Interventions:  PHQ-2: PHQ-2 Score less than 3: No follow-up or recommendations are necessary at this time (03/28/2022  3:51 PM)      Depression Interventions PHQ-2/9: Interventions: No follow-up or recommendations are necessary at this time    Depression Screening was performed on Rolla Flatten in clinic today. Based on the score of (Patient-Rptd) 0, no follow up action or recommendations are necessary at this time..  Will plan for annual screening.  BILLING 25 modifier, [phone], annually and medicare only   Creatinine clearance: CrCl cannot be calculated (Patient's most recent lab result is older than the maximum 30 days allowed.).  Health Maintenance Due:  Health Maintenance Due   Topic Date Due    MEDICARE ANNUAL WELLNESS VISIT  Never done    SHINGLES RECOMBINANT VACCINE (1 of 2) 10/30/2004    RSV VACCINE (60 YEARS AND OLDER) (1 - Risk 60-74 years 1-dose series) Never done    ADVANCE CARE PLANNING DISCUSSION AND DOCUMENTATION  12/28/2021    INFLUENZA VACCINE (1) 09/07/2022    COVID-19 VACCINE (6 - 2024-25 season) 10/08/2022      Health Maintenance completed:  Health Maintenance   Topic Date Due    MEDICARE ANNUAL WELLNESS VISIT  Never done    SHINGLES RECOMBINANT VACCINE (1 of 2) 10/30/2004    RSV VACCINE (60 YEARS AND OLDER) (1 - Risk 60-74 years 1-dose series) Never done    ADVANCE CARE PLANNING DISCUSSION AND DOCUMENTATION  12/28/2021    INFLUENZA VACCINE (1) 09/07/2022    COVID-19 VACCINE (6 - 2024-25 season) 10/08/2022    BREAST CANCER SCREENING  04/17/2023    DTAP/TDAP VACCINES (3 - Td or Tdap) 11/16/2027    COLORECTAL CANCER SCREENING  07/22/2031    OSTEOPOROSIS SCREENING/MONITORING  Completed    PNEUMOCOCCAL VACCINE AGE 69 AND OVER  Completed    DEPRESSION SCREENING  Completed    HEPATITIS C SCREENING  Addressed    HPV VACCINES  Aged Out    CERVICAL CANCER SCREENING Discontinued      Dexa:    DUE at age 15  Last Annual eye exam:  Due for next eye exam:  Hx glaucoma/ family hx mac degen; >69 y every 1-2 years  Last Dental exam:  Advised every 6 mo  HRT:  None    Annual lung Cancer screening w/ CT chest. denies tobacco hx.    Hearing difficulty?:  Yes, saw Audiology   Immunization History   Administered Date(s) Administered    COVID-19 (PFIZER), mRNA vacc, 30 mcg/0.3 mL (PF) 04/16/2019, 05/07/2019, 11/21/2019    COVID-19 Bivalent (56YR+)(PFIZER), mRNA vacc, 7mcg/0.3mL 12/28/2020    Covid-19 mRNA Vaccine >=12yo (Pfizer)(Comirnaty) 12/22/2021    Flu Vaccine =>6 Months Quadrivalent PF 12/04/2016, 11/15/2017    Flu Vaccine =>65 YO High-Dose Quadrivalent (PF) 11/21/2019    Flu Vaccine Cell Cult Quad 4+YO (PF)(Flucelvax) 11/25/2018    Flu Vaccine, Quadrivalent, Adjuvanted (Preservative Free) 12/23/2020, 10/26/2021    Flu vaccine, inj unspecified (Historical) 11/12/2018, 12/23/2020    Pneumococcal Vaccine (20-Val) 12/28/2020    Pneumococcal Vaccine (23-Val Adult) 11/25/2019    Td vaccine (>=7YO) PF IM (Decavac, Tenivac) 11/15/2017    Tdap Vaccine 02/07/2004    Zoster Vaccine, Unspecified (Historical) 09/27/2015         >20 y. Lipid panel if increased risk of CHD:  Lab Results   Component Value Date    CHOL 274 (H) 12/28/2020    TRIG 137 12/28/2020    HDL 55 12/28/2020    LDL 203 (H) 12/28/2020    VLDL 27 12/28/2020    NONHDLCHOL 219 12/28/2020    CHOLHDLC 3.3 09/07/2016       If HTN, screen for DM: cmp  If HLD, refer to nutrition for low cholesterol diet:  Screen for obesity:  There is no height or weight on file to calculate BMI.       Overweight (BMI >=25)  is above average; referral written for nutritional counseling.               Lung Cancer (Hcc)         Patient was in her usual state of health until November, 2010 when  she began having shoulder pain consistent with adhesive capsulitis. She had had adhesive capsulitis in the other shoulder and had to have it treated under anesthetic. She has had progressive pain which interferes with her sleep and activities. An xray was done showing a spiculated pulmonary nodule and she has undergone evaluation.  The nodule is 1.8 by 1.8 and is cavitary. It is in the RLL. She had a needle biopsy which is suggestive of a papillary adenocarcinoma. Her PET showed only the nodule.     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  Histologic Grade: G2: Moderately differentiated  Visceral Pleural Invasion: Not identified  Margins: (Vascular, Bronchial, Parenchymal)  Margins uninvolved by invasive carcinoma  Distance of invasive carcinoma from closest margin: 3 cm  Specify margin: Bronchial margin.  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  01/25/12: no radiology or lab testing done, will repeat at next visit in 6 months. She is doing well, no new complaints  07/25/2012 CT stable. Exam WNL. She is asymptomatic. Will see in 6 months.   08/05/2013 Exam and labs are stable.  She is asymptomatic.  She no longer needs to be seen by oncology and can continue regular follow up with her PCP.                            Ample opportunity given for questions and answers.  Patient Instructions   Thank you for coming in today!  Our plan from this visit:  The lifestyle change that you have decided on today is:   Exercise.  Hydrate with at least 80 ounces (10 glasses or 2.4L or ) of water daily unless you are on a fluid restriction.   Follow low sodium diet, 2000 mg/day or less If you are having swelling.           For There is no height or weight on file to calculate BMI.  and    Wt Readings from Last 1 Encounters:   01/24/23 63.5 kg (140 lb)    Your treatment plan includes a nutritionally balanced, reduced calorie diet, exercise and behavioral counseling.      Prednisone and doxycycline for emphysema exacerbation  Cough syrup     To join this study, they are calling patients in August/ 2024 or go to [email or web address]  The All of [place] Research Program is a historic effort to collect and study data from one million or more people living in the [place]. The goal of the program is better health for all of [place].   Our mission is to accelerate health research and medical breakthroughs, enabling individualized prevention, treatment, and care for all of [place]. This mission is carried out through three connected focus areas that are supported and made possible by a team that maintains a culture built around the program?s core values.    How Can  [place] of [place] Make a Difference?  Too often, health care is one size fits all. Treatments meant for the ?average? patient may not work well for individual people. Health care providers may find it difficult to coordinate care among specialists or to access all of a patient?s health information. Researchers may spend lots of time and resources creating new databases for every study.    All of [place] is working to improve health care through research. Unlike research studies that focus on one disease or group of people, [facility] of [place] is building a diverse database that can inform thousands of studies on a variety of health conditions. This creates more opportunities to:    Know the risk factors for certain diseases  Figure out which treatments work best for people of different backgrounds  Connect people with the right clinical studies for their needs  Learn how technologies can help [place] take steps to be healthier  Learn about what makes the [employer] [facility] [place] [employer] different.    All of [place] and Precision Medicine  The [employer] of Health formed the Precision Medicine Initiative Working Group of the [employer] to Catering manager in March 2015. The group concluded its work in September 2015 with a detailed report. The report provided a framework for setting up the All of [place] Research Program.    Precision medicine:    Is based on you as an individual  Takes into account your environment (where you live), lifestyle (what you do), and your family health history and genetic makeup  Gives health care providers the information they need to make customized recommendations for people of different backgrounds, ages, and regions  Helps you get better information about how to be healthier  Reduces health care costs by matching the right person with the right treatment the first time  [place] of [place] is part of a new era in which researchers, health care providers, Engineer, water, community partners, and the public work together to develop individualized health care. Learn more about who is involved.  Ready to join All of [place]? Visit JoinAllofUs.org to learn more.  What Makes the All of [place] Research Program Different  Breadth. With a goal of enrolling one million or more participants in the [place], [facility] [place] is building one of the [employer] of its kind. As the amount of data grows, patterns will emerge that wouldn?t be visible at a smaller scale.     Diversity. The program is enrolling a large group of people that reflects the diversity of the [place]. This includes people who haven?t taken part in or have been left out of health research before. All of [place] welcomes participants of all backgrounds and walks of life, from all regions of the country, whether they are healthy or sick.     Depth. All of [place] collects many types of data, including data from surveys, electronic health records, and blood and urine tests. Over time, participants may share data in new ways, using wearable fitness trackers and other technologies. This will help researchers get a more complete picture of factors that affect health and disease.     Duration. The initial plan for the program spans 10 years, but it may last even longer. Working with participants over the long term means the program can gather more information that will help researchers find out how health and disease change over time.     Innovation. All of [place] is working to take research  to a new level. The program is working with participants across the country, collecting many types of information over time, and building a database that many researchers can use. This new model could shape how people do research in the future. All of [place] will share lessons about what works well with other research programs around the world.     Access. All of [place] aims to make it easy for a variety of researchers--from university professors to citizen scientists--to make discoveries using the program?s data. Multiple systems and processes keep data secure and participants? personal information private.     Engagement. Participants are partners in All of [place]. Participant input is welcome on every aspect of the program to make it better. Participants will have full access to data they share and information about all research projects that use [facility] of [place] data.    If you decide to join All of [place], we will ask you to share different kinds of information over time. We will ask you basic information like your name and where you live. We will ask you questions about your health, family, home, and work. If you have an electronic health record, we may ask for access. We may ask you to go to a local clinic or drug store for a free appointment with [place]. At this appointment, we would measure your weight, height, hips, and waist, as well as your blood pressure and heart rate. We might ask you to give samples, such as blood or urine, at the appointment.    You will be able to choose how frequently we contact you. From time to time, we may send you new surveys or offer other ways for you to share information about your health.    People join for many reasons. You might join to:    Learn more about your health, including your DNA. This will include, for example, information about genetic traits and ancestry, potential increased risk of developing certain diseases, or how your body might react to certain medicines.  Help improve the health of your communities and future generations.  Help researchers find the best ways for people to stay healthy.  Help researchers one day create better tests and treatments.  The longer you stay involved with [facility] [place], the more you can learn about yourself and help speed up health research and medical breakthroughs.      Many patients do their labs a few days before their appointment with me and we will plan to discuss the results at that appointment.  But, as a part of the CARES act, starting 05/08/2019, some results will be released to mychart automatically.  I will continue to send a note on any labs that I order.  But, with these changes you may see your results before I do.  Critical lab results will be addressed immediately, but otherwise please  give me 72 hours to view and respond to your results before reaching out with questions.       We are in clinic on Mondays AM, Tuesdays AM,  Wednesday all day, Thursdays PM and Fridays AM.   If you have an urgent issue that you believe can be taken care of in our clinic by being overbooked on a Monday, Tuesday or Friday am, then Please call the nurse, Tresa Endo, at [phone] to be added to our schedule.  When overbooked for urgent issue, we will only have time to take care of the single urgent issue and cannot take care of  multiple issues at that time.      Rhoderick Moody was seen today for cough.    Diagnoses and all orders for this visit:    Well woman exam    Malignant neoplasm of lower lobe of right lung (HCC)    Other emphysema (HCC)    COPD exacerbation (HCC)    Other orders  -     predniSONE (DELTASONE) 20 mg tablet; Take two tablets by mouth daily for 5 days.  -     doxycycline hyclate (VIBRACIN) 100 mg tablet; Take one tablet by mouth twice daily for 7 days.  -     promethazine-dextromethorphan (PHENERGAN DM) 6.25-15 mg/5 mL syrup; Take 5 mL by mouth every 4 hours as needed for Cough.         You are due for these screening tests and/ or vaccines.  If you believe you have completed these items, please bring [place] these records or you can fax them to [phone].   Health Maintenance Due   Topic Date Due    MEDICARE ANNUAL WELLNESS VISIT  Never done    SHINGLES RECOMBINANT VACCINE (1 of 2) 10/30/2004    RSV VACCINE (60 YEARS AND OLDER) (1 - Risk 60-74 years 1-dose series) Never done    ADVANCE CARE PLANNING DISCUSSION AND DOCUMENTATION  12/28/2021    INFLUENZA VACCINE (1) 09/07/2022    COVID-19 VACCINE (6 - 2024-25 season) 10/08/2022        You have completed these vaccines:  Immunization History   Administered Date(s) Administered    COVID-19 (PFIZER), mRNA vacc, 30 mcg/0.3 mL (PF) 04/16/2019, 05/07/2019, 11/21/2019    COVID-19 Bivalent (35YR+)(PFIZER), mRNA vacc, 16mcg/0.3mL 12/28/2020    Covid-19 mRNA Vaccine >=12yo (Pfizer)(Comirnaty) 12/22/2021    Flu Vaccine =>6 Months Quadrivalent PF 12/04/2016, 11/15/2017    Flu Vaccine =>65 YO High-Dose Quadrivalent (PF) 11/21/2019    Flu Vaccine Cell Cult Quad 4+YO (PF)(Flucelvax) 11/25/2018    Flu Vaccine, Quadrivalent, Adjuvanted (Preservative Free) 12/23/2020, 10/26/2021    Flu vaccine, inj unspecified (Historical) 11/12/2018, 12/23/2020    Pneumococcal Vaccine (20-Val) 12/28/2020    Pneumococcal Vaccine (23-Val Adult) 11/25/2019    Td vaccine (>=7YO) PF IM (Decavac, Tenivac) 11/15/2017    Tdap Vaccine 02/07/2004    Zoster Vaccine, Unspecified (Historical) 09/27/2015        If you ever have emergency symptoms of chest pain, shortness of breath or uncontrolled or unexplained pain, please go to your closest emergency room.  You can give [place] an update after you have addressed any emergency.      Great to see you today!  Dr. Hollace Kinnier and the General Internal Medicine Clinic, [facility]. Kelly's direct number is [phone] (not answered outside business hours).  Scheduling is [phone]; our fax is [phone].       No follow-ups on file.           Patient seen in audiovisual communication using [employer] or [employer] app.    Time based exception:     Visit start time: 0948      Visit end time: 0955  Total time  and  Estimated counseling time: 7 + 5 minutes pre and post visit documentation.  Patient Counseling:  Rhoderick Moody was seen today for cough.    Diagnoses and all orders for this visit:    Well woman exam    Malignant neoplasm of lower lobe of right lung (HCC)    Other emphysema (HCC)    COPD  exacerbation (HCC)    Other orders  -     predniSONE (DELTASONE) 20 mg tablet; Take two tablets by mouth daily for 5 days.  -     doxycycline hyclate (VIBRACIN) 100 mg tablet; Take one tablet by mouth twice daily for 7 days.  -     promethazine-dextromethorphan (PHENERGAN DM) 6.25-15 mg/5 mL syrup; Take 5 mL by mouth every 4 hours as needed for Cough.    .   Time-based billing documentation  The total time spent in this patient's care during the day of the visit was 12 minutes.  This includes but is not limited to time spent preparing to see the patient, performing a medically appropriate interview, examination and evaluation, counseling and education, documenting clinical information in the electronic health record, and coordinating care.      Time was also spent:   Ordering medications

## 2023-02-19 ENCOUNTER — Encounter: Admit: 2023-02-19 | Discharge: 2023-02-19 | Payer: MEDICARE

## 2023-02-19 ENCOUNTER — Ambulatory Visit: Admit: 2023-02-19 | Discharge: 2023-02-20 | Payer: MEDICARE

## 2023-03-05 ENCOUNTER — Encounter: Admit: 2023-03-05 | Discharge: 2023-03-05 | Payer: MEDICARE

## 2023-03-05 NOTE — Telephone Encounter
Patient calling regarding recent URI.  Patient states she was recently evaluated and treated for a URI.  She had completed the medications prescribed and reported that she was actually feeling somewhat better.  She is currently on vacation in [place] and reports that her symptoms have returned.  She has chest congestion and unable to get the phlegm out of her chest, she reports that he cough has returned as well.    Patient requesting a repeat dosage of her recent medications that she was previously prescribed.    If medications approved I will try to find out what pharmacy she would like them sent to.    Kirby Funk, LPN

## 2023-03-05 NOTE — Telephone Encounter
Sent response from Dr. Noreene Filbert to the patient via Mychart.    Kirby Funk, LPN

## 2023-03-05 NOTE — Telephone Encounter
Please ask her to go to local urgent care if she has a fever of 100.4 or higher or severe symptoms for evaluation for treatment

## 2023-03-05 NOTE — Telephone Encounter
Sent Dr. Sydnee Cabal response via Mychart.  Kirby Funk, LPN

## 2023-03-15 ENCOUNTER — Encounter: Admit: 2023-03-15 | Discharge: 2023-03-15 | Payer: MEDICARE

## 2023-03-29 ENCOUNTER — Encounter: Admit: 2023-03-29 | Discharge: 2023-03-29 | Payer: MEDICARE

## 2023-04-04 ENCOUNTER — Encounter: Admit: 2023-04-04 | Discharge: 2023-04-04 | Payer: MEDICARE

## 2023-04-04 ENCOUNTER — Ambulatory Visit: Admit: 2023-04-04 | Discharge: 2023-04-04 | Payer: MEDICARE

## 2023-04-04 DIAGNOSIS — Z0000 Encounter for general adult medical examination without abnormal findings: Secondary | ICD-10-CM

## 2023-04-04 DIAGNOSIS — N390 Urinary tract infection, site not specified: Secondary | ICD-10-CM

## 2023-04-04 DIAGNOSIS — M8580 Other specified disorders of bone density and structure, unspecified site: Secondary | ICD-10-CM

## 2023-04-04 DIAGNOSIS — C3431 Malignant neoplasm of lower lobe, right bronchus or lung: Secondary | ICD-10-CM

## 2023-04-04 DIAGNOSIS — Z01419 Encounter for gynecological examination (general) (routine) without abnormal findings: Secondary | ICD-10-CM

## 2023-04-04 DIAGNOSIS — E785 Hyperlipidemia, unspecified: Secondary | ICD-10-CM

## 2023-04-04 DIAGNOSIS — G43709 Chronic migraine without aura, not intractable, without status migrainosus: Secondary | ICD-10-CM

## 2023-04-04 MED ORDER — SHINGRIX (PF) 50 MCG/0.5 ML IM SUSR
.5 mL | Freq: Once | INTRAMUSCULAR | 0 refills | Status: AC
Start: 2023-04-04 — End: ?

## 2023-04-04 MED ORDER — ESCITALOPRAM OXALATE 20 MG PO TAB
20 mg | ORAL_TABLET | Freq: Every day | ORAL | 4 refills | Status: AC
Start: 2023-04-04 — End: ?

## 2023-04-04 MED ORDER — SUMATRIPTAN SUCCINATE 25 MG PO TAB
ORAL_TABLET | SUBCUTANEOUS | 4 refills | 30.00000 days | Status: AC
Start: 2023-04-04 — End: ?

## 2023-04-04 MED ORDER — ATORVASTATIN 40 MG PO TAB
40 mg | ORAL_TABLET | Freq: Every evening | ORAL | 4 refills | Status: AC
Start: 2023-04-04 — End: ?

## 2023-04-04 MED ORDER — TOPIRAMATE 100 MG PO TAB
100 mg | ORAL_TABLET | Freq: Two times a day (BID) | ORAL | 4 refills | Status: AC
Start: 2023-04-04 — End: ?

## 2023-04-04 NOTE — Progress Notes
 Estimated body mass index is 22.74 kg/m? as calculated from the following:    Height as of this encounter: 170.2 cm (5' 7).    Weight as of this encounter: 65.9 kg (145 lb 3.2 oz).   Vitals:    04/04/23 1252   BP: 117/90   Pulse: 70   Temp: 36.4 ?C (97.5 ?F)   Resp: 18        BP Readings from Last 3 Encounters:   04/04/23 117/90   01/24/23 112/78   12/19/22 116/84        Vitals:    04/04/23 1252   BP: 117/90   Pulse: 70   Temp: 36.4 ?C (97.5 ?F)   Resp: 18   TempSrc: Oral   PainSc: Zero   Weight: 65.9 kg (145 lb 3.2 oz)   Height: 170.2 cm (5' 7)          Patient Care Team:  Werner Lean, MD as PCP - General (Internal Medicine)  Gerri Lins, MD  Raleigh Callas, MD (Inactive) (Oncology)  Elie Confer (Cardiovascular Disease)  Ky Barban Heath Gold, MD (Thoracic Surgery)  Werner Lean, MD as CCP - Continuity of Care Provider (Internal Medicine)        Date of Service: 04/04/2023    Melinda Goodman is a 68 y.o. female.  DOB: 1955-10-07  MRN: [id number]     SUBJECTIVE       She presents today for an Annual Medicare Wellness visit.   Chief Complaint   Patient presents with    Annual Wellness Visit    Physical     Patient answers are not available for this visit.      Problem List Review     I have reviewed and updated the problem list below and addressed acute and chronic conditions with patient or recommended a follow up plan.   Patient Active Problem List    Diagnosis Date Diagnosed    COPD exacerbation (HCC)     Other emphysema (HCC)     Lung nodule     Acute bronchitis     Osteopenia     Closed nondisplaced fracture of proximal phalanx of left little finger with routine healing     Anemia     HLD (hyperlipidemia)     Hot flashes     Lateral epicondylitis of right elbow     History of glaucoma     Hormone replacement therapy (postmenopausal)     Insomnia secondary to anxiety     Recurrent UTI     Conductive hearing loss     Well woman exam     Lung cancer (HCC)     Mood disorder ()     Allergic rhinitis     DJD (degenerative joint disease)     Human papilloma virus infection     Migraine headache           Risk Review   Lab Draw:  Lab Results   Component Value Date/Time    HGBA1C 4.9 03/12/2009 02:16 PM     POC:  No results found for: A1C    Lab Results   Component Value Date    CHOL 274 12/28/2020    LDL 203 12/28/2020     The 10-year ASCVD risk score (Arnett DK, et al., 2019) is: 7.3%    Values used to calculate the score:      Age: 55 years      Sex: Female  Is Non-Hispanic African American: No      Diabetic: No      Tobacco smoker: No      Systolic Blood Pressure: 117 mmHg      Is BP treated: No      HDL Cholesterol: 55 MG/DL      Total Cholesterol: 274 MG/DL  Preventive Medications   Statin assessment: I have reviewed the patient's ASCVD risk and patient: Patient is on statin    Aspirin assessment: Patient does not have a history of cardiovascular disease and after risk benefit assessment, aspirin not indicated.    Preventive Screening   I reviewed the preventive care and immunizations information with the patient and discussed a plan of care.    Health Maintenance   Topic Date Due    SHINGLES RECOMBINANT VACCINE (1 of 2) 10/30/2004    RSV VACCINE (60 years and Older/Pregnant Women) (1 - Risk 60-74 years 1-dose series) Never done    COVID-19 VACCINE (6 - 2024-25 season) 10/08/2022    BREAST CANCER SCREENING  04/17/2023    MEDICARE ANNUAL WELLNESS VISIT  04/03/2024    ADVANCE CARE PLANNING DISCUSSION AND DOCUMENTATION  04/03/2024    DTAP/TDAP VACCINES (3 - Td or Tdap) 11/16/2027    COLORECTAL CANCER SCREENING  07/22/2031    OSTEOPOROSIS SCREENING/MONITORING  Completed    PNEUMOCOCCAL VACCINE AGE 29 AND OVER  Completed    DEPRESSION SCREENING  Completed    INFLUENZA VACCINE  Completed    HEPATITIS C SCREENING  Addressed    HPV VACCINES  Aged Out    MENINGOCOCCAL B VACCINE  Aged Out    CERVICAL CANCER SCREENING  Discontinued          Health Risk Assessment Questionnaire     The patient completed a health risk assessment with results reviewed and addressed with patient.    Health Risk Assessment Questionnaire  Current Care  List of Providers you have seen in the last two years: People in the [facility] system.  Are you receiving home health?: No  During the past 4 weeks, how would you rate your health in general?: Very Good    Outside Care  Since your last PCP visit, have you received care outside of [facility]?: No          Physical Activity  Do you exercise or are you physically active?: Yes  How many days a week do you usually exercise or are physically active?: 2  On days when you exercised or were physically active, how many minutes was the activity?: 45  During the past four weeks, what was the hardest physical activity you could do for at least two minutes?: Moderate    Diet  In the past month, were you worried whether your food would run out before you or your family had money to buy more?: No  In the past 7 days, how many times did you eat fast food or junk food or pizza?: 1  In the past 7 days, how many servings of fruits or vegetables did you eat each day?: (!) 2-3 encourage to increase  In the past 7 days, how many sodas and sugar sweetened drinks (regular, not diet) did you drink each day?: 1    Smoke/Tobacco Use  Are you currently a smoker?: No       Alcohol Use  Do you drink alcohol?: Yes  10 drinks/ week.   Are you Female or Female?: Female     Female:  In the last three months, have you had >3 alcoholic beverages in any one day or >7 in any one week?: (!) Yes     Pain  How would you rate your pain today?: (!) Mild pain headaches    Ambulation  Do you use any assistive devices for ambulation?: No       Fall Risk  Does it take you longer than 30 seconds to get up and out of a chair?: No  Have you fallen in the past year?: (!) Yes  Fall History (last 54mo): (!) Two or More Falls  Patient denies PT needs    Motor Vehicle Safety  Do you fasten your seat belt when you are in the car?: Yes    Sun Exposure  Do you protect yourself from the sun? For example, wear sunscreen when outside.: Yes    Hearing Loss  Do you have trouble hearing the television or radio when others do not?: No  Do you have to strain or struggle to hear/understand conversation?: No  Do you use hearing aids?: No    Cognitive Impairment  During the past 12 months, have you experienced confusion or memory loss that is happening more often or is getting worse?: No    Functional Screen  Do you live alone?: Yes  Do you live at: Home  Can you drive your own car or travel alone by bus or taxi?: Yes  Can you shop for groceries or clothes without help?: Yes  Can you prepare your own meals?: Yes  Can you do your own housework without help?: Yes  Can you handle your own money without help?: Yes  Do you need help eating, bathing, dressing, or getting around your home?: No  Do you feel safe?: Yes  Does anyone at home hurt you, hit you, or threaten you?: No  Have you ever been the victim of abuse?: No    Home Safety  Does your home have grab bars in the bathroom?: (!) No consider for future.   Does your home have hand rails on stairs and steps?: Yes  Does your home have functioning smoke alarms?: Yes    Advance Directive  Do you have a living will or Advance Directive?: Yes       Dental Screen  Have you had an exam by your dentist in the last year?: Yes    Vision Screen  Do you have diabetes?: No             Urinary Incontinence  Have you had urine leakage in the past 6 months?: No       Social Drivers of Health            History Review     Past Medical History:   Diagnosis Date    Back pain     Mild off and on. 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    Lateral epicondylitis of right elbow 03/21/2017    started 11/2016, improved with PT 03/2017 coming back slightly  likes to golf and play tennis  PLAN encouraged patient that she may need to consider repeat PT  she will consider this and let me know if she would like to pursue PT again     Lung cancer (HCC) 02/24/2009     Patient was in her usual state of health until November, 2010 when she began having shoulder pain consistent with adhesive capsulitis. She had had adhesive capsulitis in the other shoulder and had to have it treated under anesthetic. She has had progressive pain which interferes with her sleep and activities. An xray was done showing a spiculated pulmonary nodule and she has undergone evaluation.    Menopause 11/2003    HRT    Migraine headache 03/19/2006    topamax; excedrin migraine prn; migraine 3-4x/ day; imitrex prn     Mood disorder () 03/19/2006    Non-small cell lung cancer (HCC)     Other emphysema (HCC) 09/27/2022    Recurrent UTI 02/18/2016    - no urinary symptoms at this time, though this has been a problem in the past - encouraged patient to call if develops symptoms    Seasonal allergies     allegra qday     Past Surgical History:   Procedure Laterality Date    COLONOSCOPY  2013    COLONOSCOPY N/A 07/21/2021    COLONOSCOPY DIAGNOSTIC WITH SPECIMEN COLLECTION BY BRUSHING/ WASHING - FLEXIBLE performed by Samuel Jester, MD at [id number] OR    HX PNEUMONECTOMY      RLL    HX TONSILLECTOMY      SHOULDER SURGERY      Both frozen shoulders      reports that she has never smoked. She has never used smokeless tobacco. She reports current alcohol use of about 6.0 standard drinks of alcohol per week. She reports being sexually active and has had partner(s) who are female. She reports using the following method of birth control/protection: Post-menopausal. She reports that she does not use drugs.  Family History   Problem Relation Age of Onset    Cancer-Prostate Father     Hypertension Father     High Cholesterol Father     GI Problem Father         ulcers    Cancer-Breast Maternal Aunt     Cancer-Breast Maternal Grandmother     Diabetes Maternal Grandmother     Cancer-Breast Paternal Grandmother     Blindness Mother     Macular Degen Mother     Obesity Mother     Hypertension Mother     High Cholesterol Mother     Obesity Brother     Other Brother         GERD    Diabetes Grandparent     Cancer Father     Cancer Grandparent                  No Known Allergies  Review of Systems  Review of Systems    OBJECTIVE     Vitals:    04/04/23 1252   BP: 117/90   Pulse: 70   Temp: 36.4 ?C (97.5 ?F)   Resp: 18   TempSrc: Oral   PainSc: Zero   Weight: 65.9 kg (145 lb 3.2 oz)   Height: 170.2 cm (5' 7)     Body mass index is 22.74 kg/m?Marland Kitchen   Physical Exam       Depression Screening            Mini-Cog  Steps:   1. Tell patient 3 words.   2. Patient draws clock at 11:10: Normal clock, 2 points  3. Patient recalls words: Recalled 3 words, 3 points  Score and follow up: 3-5 points - lower risk of dementia, referral on case-by-case basis     Get-up-and-go Test  Greater then 12 seconds, abnormal, consider PT referral  Patient denies needs.     Medications   I have completed a medication reconciliation, discussed medication adherence, and reviewed the list for high-risk medications (BEERS) and results are below:      atorvastatin (LIPITOR) 40 mg tablet Take one tablet by mouth at bedtime daily.    escitalopram oxalate (LEXAPRO) 20 mg tablet TAKE 1 TABLET BY MOUTH DAILY    fexofenadine(+) (ALLEGRA) 180 mg PO tablet Take one tablet by mouth every morning.    ibuprofen/diphenhydramine cit (ADVIL PM PO) Take  by mouth.    SUMAtriptan succinate (IMITREX) 25 mg tablet Take one tablet by mouth at onset of headache. May repeat after 2 hours if needed. Max of 200 mg in 24 hours.    topiramate (TOPAMAX) 100 mg tablet TAKE 1 TABLET BY MOUTH TWICE  DAILY    varicella-zoster gE-AS01B (PF) (SHINGRIX (PF)) 50 mcg/0.5 mL injection Inject 0.5 mL into the muscle once for 1 dose. IM: 0.5 mL administered as a 2-dose series at 0 and 2 to 6 months  Fax verification of vaccination to [phone]          ASSESSMENT AND PLAN       Personal prevention plan reviewed with patient and is accessible via patient's After Visit Summary and visit note.    Melinda Goodman was seen today for annual wellness visit and physical.    Diagnoses and all orders for this visit:    Well woman exam  -     COMPREHENSIVE METABOLIC PANEL; Future; Expected date: 04/03/2024  -     LIPID PROFILE; Future; Expected date: 04/03/2024  -     CBC; Future; Expected date: 04/03/2024  -     TSH WITH FREE T4 REFLEX; Future; Expected date: 04/03/2024    Hyperlipidemia, unspecified hyperlipidemia type  -     COMPREHENSIVE METABOLIC PANEL; Future; Expected date: 04/03/2024  -     LIPID PROFILE; Future; Expected date: 04/03/2024  -     CBC; Future; Expected date: 04/03/2024  -     TSH WITH FREE T4 REFLEX; Future; Expected date: 04/03/2024    Visit for preventive health examination    Encounter for Medicare annual wellness exam    Malignant neoplasm of lower lobe of right lung (HCC)    Anemia, unspecified type  -     COMPREHENSIVE METABOLIC PANEL; Future; Expected date: 04/03/2024  -     LIPID PROFILE; Future; Expected date: 04/03/2024  -     CBC; Future; Expected date: 04/03/2024  -     TSH WITH FREE T4 REFLEX; Future; Expected  date: 04/03/2024    Osteopenia, unspecified location    Other emphysema (HCC)    Chronic migraine without aura without status migrainosus, not intractable    Breast cancer screening by mammogram    BMI 22.0-22.9, adult    Other orders  -     varicella-zoster gE-AS01B (PF) (SHINGRIX (PF)) 50 mcg/0.5 mL injection; Inject 0.5 mL into the muscle once for 1 dose. IM: 0.5 mL administered as a 2-dose series at 0 and 2 to 6 months  Fax verification of vaccination to [phone]  -     FLU VACCINE =>65 YO HIGH-DOSE TRIVALENT PF  -     Cancel: COVID-19 VACCINE (>=12YO) (MODERNA) MRNA PF      New Medication Orders this Encounter   Medications    varicella-zoster gE-AS01B (PF) (SHINGRIX (PF)) 50 mcg/0.5 mL injection     Sig: Inject 0.5 mL into the muscle once for 1 dose. IM: 0.5 mL administered as a 2-dose series at 0 and 2 to 6 months  Fax verification of vaccination to [phone]     Dispense:  1 each     Refill:  0      Medications Discontinued During This Encounter   Medication Reason    promethazine-dextromethorphan (PHENERGAN DM) 6.25-15 mg/5 mL syrup Therapy completed     Patient Instructions   Thank you for coming in today!  Our plan from this visit:  The lifestyle change that you have decided on today is:   Exercise.  Hydrate with at least 80 ounces (10 glasses or 2.4L or ) of water daily unless you are on a fluid restriction.   Follow low sodium diet, 2000 mg/day or less If you are having swelling.           For Body mass index is 22.74 kg/m?Marland Kitchen  and    Wt Readings from Last 1 Encounters:   04/04/23 65.9 kg (145 lb 3.2 oz)    Your treatment plan includes a nutritionally balanced, reduced calorie diet, exercise and behavioral counseling.      Refills done 04/04/2023    Fasting labs in 12  months prior to appt.    Please schedule: 09/27/2023 due for 12 mo CT chest   Try migralief.     To join this study, they are calling patients in August/ 2024 or go to [email or web address]  The All of [place] Research Program is a historic effort to collect and study data from one million or more people living in the [place]. The goal of the program is better health for all of [place].   Our mission is to accelerate health research and medical breakthroughs, enabling individualized prevention, treatment, and care for all of [place]. This mission is carried out through three connected focus areas that are supported and made possible by a team that maintains a culture built around the program?s core values.    How Can All of [place] Make a Difference?  Too often, health care is one size fits all. Treatments meant for the ?average? patient may not work well for individual people. Health care providers may find it difficult to coordinate care among specialists or to access all of a patient?s health information. Researchers may spend lots of time and resources creating new databases for every study.    [facility] of [place] is working to improve health care through research. Unlike research studies that focus on one disease or group of people, All of [place] is building a diverse database that can inform thousands of  studies on a variety of health conditions. This creates more opportunities to:    Know the risk factors for certain diseases  Figure out which treatments work best for people of different backgrounds  Connect people with the right clinical studies for their needs  Learn how technologies can help [place] take steps to be healthier  Learn about what makes the [facility] [place] [employer] different.    [place] of [place] and Precision Medicine  The [employer] of Health formed the Precision Medicine Initiative Working Group of the [employer] to Catering manager in March 2015. The group concluded its work in September 2015 with a detailed report. The report provided a framework for setting up the All of [place] Research Program.    Precision medicine:    Is based on you as an individual  Takes into account your environment (where you live), lifestyle (what you do), and your family health history and genetic makeup  Gives health care providers the information they need to make customized recommendations for people of different backgrounds, ages, and regions  Helps you get better information about how to be healthier  Reduces health care costs by matching the right person with the right treatment the first time  [place] of [place] is part of a new era in which researchers, health care providers, Engineer, water, community partners, and the public work together to develop individualized health care. Learn more about who is involved.  Ready to join All of [place]? Visit JoinAllofUs.org to learn more.  What Makes the All of [place] Research Program Different  Breadth. With a goal of enrolling one million or more participants in the [place], [facility] of [place] is building one of the [employer] of its kind. As the amount of data grows, patterns will emerge that wouldn?t be visible at a smaller scale.     Diversity. The program is enrolling a large group of people that reflects the diversity of the [place]. This includes people who haven?t taken part in or have been left out of health research before. All of [place] welcomes participants of all backgrounds and walks of life, from all regions of the country, whether they are healthy or sick.     Depth. All of [place] collects many types of data, including data from surveys, electronic health records, and blood and urine tests. Over time, participants may share data in new ways, using wearable fitness trackers and other technologies. This will help researchers get a more complete picture of factors that affect health and disease.     Duration. The initial plan for the program spans 10 years, but it may last even longer. Working with participants over the long term means the program can gather more information that will help researchers find out how health and disease change over time.     Innovation. [facility] of [place] is working to take research to a new level. The program is working with participants across the country, collecting many types of information over time, and building a database that many researchers can use. This new model could shape how people do research in the future. [facility] of [place] will share lessons about what works well with other research programs around the world.     Access. [facility] [place] aims to make it easy for a variety of researchers--from university professors to citizen scientists--to make discoveries using the program?s data. Multiple systems and processes keep data secure and participants? personal information private.  Engagement. Participants are partners in [facility] of [place]. Participant input is welcome on every aspect of the program to make it better. Participants will have full access to data they share and information about all research projects that use [facility] of [place] data.    If you decide to join [facility] of [place], we will ask you to share different kinds of information over time. We will ask you basic information like your name and where you live. We will ask you questions about your health, family, home, and work. If you have an electronic health record, we may ask for access. We may ask you to go to a local clinic or drug store for a free appointment with [place]. At this appointment, we would measure your weight, height, hips, and waist, as well as your blood pressure and heart rate. We might ask you to give samples, such as blood or urine, at the appointment.    You will be able to choose how frequently we contact you. From time to time, we may send you new surveys or offer other ways for you to share information about your health.    People join for many reasons. You might join to:    Learn more about your health, including your DNA. This will include, for example, information about genetic traits and ancestry, potential increased risk of developing certain diseases, or how your body might react to certain medicines.  Help improve the health of your communities and future generations.  Help researchers find the best ways for people to stay healthy.  Help researchers one day create better tests and treatments.  The longer you stay involved with [facility] [place], the more you can learn about yourself and help speed up health research and medical breakthroughs.      Many patients do their labs a few days before their appointment with me and we will plan to discuss the results at that appointment.  But, as a part of the CARES act, starting 05/08/2019, some results will be released to mychart automatically.  I will continue to send a note on any labs that I order.  But, with these changes you may see your results before I do.  Critical lab results will be addressed immediately, but otherwise please  give me 72 hours to view and respond to your results before reaching out with questions.       We are in clinic on Mondays AM, Tuesdays AM,  Wednesday all day, Thursdays PM and Fridays AM.   If you have an urgent issue that you believe can be taken care of in our clinic by being overbooked on a Monday, Tuesday or Friday am, then Please call the nurse, Tresa Endo, at [phone] to be added to our schedule.  When overbooked for urgent issue, we will only have time to take care of the single urgent issue and cannot take care of multiple issues at that time.      Melinda Goodman was seen today for annual wellness visit and physical.    Diagnoses and all orders for this visit:    Well woman exam  -     COMPREHENSIVE METABOLIC PANEL; Future; Expected date: 04/03/2024  -     LIPID PROFILE; Future; Expected date: 04/03/2024  -     CBC; Future; Expected date: 04/03/2024  -     TSH WITH FREE T4 REFLEX; Future; Expected date: 04/03/2024    Hyperlipidemia, unspecified hyperlipidemia type  -     COMPREHENSIVE  METABOLIC PANEL; Future; Expected date: 04/03/2024  -     LIPID PROFILE; Future; Expected date: 04/03/2024  -     CBC; Future; Expected date: 04/03/2024  -     TSH WITH FREE T4 REFLEX; Future; Expected date: 04/03/2024    Visit for preventive health examination    Encounter for Medicare annual wellness exam    Malignant neoplasm of lower lobe of right lung (HCC)    Anemia, unspecified type  -     COMPREHENSIVE METABOLIC PANEL; Future; Expected date: 04/03/2024  -     LIPID PROFILE; Future; Expected date: 04/03/2024  -     CBC; Future; Expected date: 04/03/2024  -     TSH WITH FREE T4 REFLEX; Future; Expected date: 04/03/2024    Osteopenia, unspecified location    Other emphysema (HCC)    Chronic migraine without aura without status migrainosus, not intractable    Breast cancer screening by mammogram    BMI 22.0-22.9, adult    Other orders  -     varicella-zoster gE-AS01B (PF) (SHINGRIX (PF)) 50 mcg/0.5 mL injection; Inject 0.5 mL into the muscle once for 1 dose. IM: 0.5 mL administered as a 2-dose series at 0 and 2 to 6 months  Fax verification of vaccination to [phone]  -     FLU VACCINE =>65 YO HIGH-DOSE TRIVALENT PF  -     Cancel: COVID-19 VACCINE (>=12YO) (MODERNA) MRNA PF         You are due for these screening tests and/ or vaccines.  If you believe you have completed these items, please bring [place] these records or you can fax them to [phone].   Health Maintenance Due   Topic Date Due    SHINGLES RECOMBINANT VACCINE (1 of 2) 10/30/2004    RSV VACCINE (60 years and Older/Pregnant Women) (1 - Risk 60-74 years 1-dose series) Never done    COVID-19 VACCINE (6 - 2024-25 season) 10/08/2022    BREAST CANCER SCREENING  04/17/2023        You have completed these vaccines:  Immunization History   Administered Date(s) Administered    COVID-19 (PFIZER), mRNA vacc, 30 mcg/0.3 mL (PF) 04/16/2019, 05/07/2019, 11/21/2019    COVID-19 Bivalent (62YR+)(PFIZER), mRNA vacc, 73mcg/0.3mL 12/28/2020    Covid-19 mRNA Vaccine >=12yo (Pfizer)(Comirnaty) 12/22/2021    Flu Vaccine =>6 Months Quadrivalent PF 12/04/2016, 11/15/2017    Flu Vaccine =>65 YO High-Dose Quadrivalent (PF) 11/21/2019 Flu Vaccine =>65 YO High-dose Trivalent PF 04/04/2023    Flu Vaccine Cell Cult Quad 4+YO (PF)(Flucelvax) 11/25/2018    Flu Vaccine, Quadrivalent, Adjuvanted (Preservative Free) 12/23/2020, 10/26/2021    Flu vaccine, inj unspecified (Historical) 11/12/2018, 12/23/2020    Pneumococcal Vaccine (20-Val) 12/28/2020    Pneumococcal Vaccine (23-Val Adult) 11/25/2019    Td vaccine (>=7YO) PF IM (Decavac, Tenivac) 11/15/2017    Tdap Vaccine 02/07/2004    Zoster Vaccine, Unspecified (Historical) 09/27/2015        If you ever have emergency symptoms of chest pain, shortness of breath or uncontrolled or unexplained pain, please go to your closest emergency room.  You can give [place] an update after you have addressed any emergency.      Great to see you today!  Dr. Hollace Kinnier and the [facility], [facility]. [facility]'s direct number is [phone] (not answered outside business hours).  Scheduling is [phone]; our fax is [phone].  No future appointments.                                        Other problems noted:   Assessment and Plan:                 Problem   Other Emphysema (Hcc)      09/27/2022 seen on CT, no current s/s or treatments.   Smoked in College     Osteopenia     12/28/2020 DEXA  Ca, vit D, exercise.      Anemia         CBC w diff    Lab Results   Component Value Date/Time    WBC 5.0 12/28/2020 10:39 AM    RBC 4.10 12/28/2020 10:39 AM    HGB 12.6 12/28/2020 10:39 AM    HCT 37.2 12/28/2020 10:39 AM    MCV 90.6 12/28/2020 10:39 AM    MCH 30.8 12/28/2020 10:39 AM    MCHC 34.0 12/28/2020 10:39 AM    RDW 13.5 12/28/2020 10:39 AM    PLTCT 228 12/28/2020 10:39 AM    MPV 7.0 12/28/2020 10:39 AM    Lab Results   Component Value Date/Time    NEUT 67 11/18/2018 09:28 AM    ANC 3.18 11/18/2018 09:28 AM    LYMA 17 (L) 11/18/2018 09:28 AM    ALC 0.80 (L) 11/18/2018 09:28 AM    MONA 13 (H) 11/18/2018 09:28 AM    AMC 0.60 11/18/2018 09:28 AM    EOSA 2 11/18/2018 09:28 AM AEC 0.10 11/18/2018 09:28 AM    BASA 1 11/18/2018 09:28 AM    ABC 0.03 11/18/2018 09:28 AM            Hld (Hyperlipidemia)    Hepatic Function    Lab Results   Component Value Date/Time    ALBUMIN 4.2 12/28/2020 10:39 AM    TOTPROT 7.1 12/28/2020 10:39 AM    ALKPHOS 74 12/28/2020 10:39 AM    Lab Results   Component Value Date/Time    AST 17 12/28/2020 10:39 AM    ALT 14 12/28/2020 10:39 AM    TOTBILI 0.4 12/28/2020 10:39 AM        Lab Results   Component Value Date    CHOL 274 (H) 12/28/2020    TRIG 137 12/28/2020    HDL 55 12/28/2020    LDL 203 (H) 12/28/2020    VLDL 27 12/28/2020    NONHDLCHOL 219 12/28/2020    CHOLHDLC 3.3 09/07/2016       BP Readings from Last 3 Encounters:   01/24/23 112/78   12/19/22 116/84   03/29/22 99/77     67 y.o.   2013 ACC & AHA cholesterol guidelines:   The 10-year ASCVD risk score (Arnett DK, et al., 2019) is: 6.7%    Values used to calculate the score:      Age: 41 years      Sex: Female      Is Non-Hispanic African American: No      Diabetic: No      Tobacco smoker: No      Systolic Blood Pressure: 112 mmHg      Is BP treated: No      HDL Cholesterol: 55 MG/DL      Total Cholesterol: 274 MG/DL       Advised heart healthy diet and daily exercise.  12/28/2020  Inc to lipitor 40 qhs start.       Well Woman Exam    04/04/2023 physical done   09/27/2023 due for 12 mo CT chest       Cervical Cancer screening: only one time abnormal, colposcopy was normal.  Lung Cancer screening: history of lung cancer - released by Dr. Ladona Ridgel 2015     Last screened 11/15/2017 68 y.o., if >65y, will plan annual screening. Depression Screening:  Patient Scores:  PHQ-2: PHQ-2 Score: (Patient-Rptd) 0 (04/01/2023  9:48 PM)      PHQ-9: No data recorded  Interventions:  PHQ-2: No data recorded      Depression Interventions PHQ-2/9: No data recorded    Depression Screening was performed on Melinda Goodman in clinic today. Based on the score of (Patient-Rptd) 0, no follow up action or recommendations are necessary at this time..  Will plan for annual screening.  BILLING 25 modifier, [phone], annually and medicare only   Creatinine clearance: CrCl cannot be calculated (Patient's most recent lab result is older than the maximum 30 days allowed.).  Health Maintenance Due:  Health Maintenance Due   Topic Date Due    MEDICARE ANNUAL WELLNESS VISIT  Never done    SHINGLES RECOMBINANT VACCINE (1 of 2) 10/30/2004    RSV VACCINE (60 years and Older/Pregnant Women) (1 - Risk 60-74 years 1-dose series) Never done    ADVANCE CARE PLANNING DISCUSSION AND DOCUMENTATION  12/28/2021    INFLUENZA VACCINE (1) 09/07/2022    COVID-19 VACCINE (6 - 2024-25 season) 10/08/2022    BREAST CANCER SCREENING  04/17/2023      Health Maintenance completed:  Health Maintenance   Topic Date Due    MEDICARE ANNUAL WELLNESS VISIT  Never done    SHINGLES RECOMBINANT VACCINE (1 of 2) 10/30/2004    RSV VACCINE (60 years and Older/Pregnant Women) (1 - Risk 60-74 years 1-dose series) Never done    ADVANCE CARE PLANNING DISCUSSION AND DOCUMENTATION  12/28/2021    INFLUENZA VACCINE (1) 09/07/2022    COVID-19 VACCINE (6 - 2024-25 season) 10/08/2022    BREAST CANCER SCREENING  04/17/2023    DTAP/TDAP VACCINES (3 - Td or Tdap) 11/16/2027    COLORECTAL CANCER SCREENING  07/22/2031    OSTEOPOROSIS SCREENING/MONITORING  Completed    PNEUMOCOCCAL VACCINE AGE 18 AND OVER  Completed    DEPRESSION SCREENING  Completed    HEPATITIS C SCREENING  Addressed    HPV VACCINES  Aged Out    MENINGOCOCCAL B VACCINE  Aged Out    CERVICAL CANCER SCREENING  Discontinued      Dexa:    DUE at age 37  Last Annual eye exam:  Due for next eye exam:  Hx glaucoma/ family hx mac degen; >51 y every 1-2 years  Last Dental exam:  Advised every 6 mo  HRT:  None    Annual lung Cancer screening w/ CT chest. denies tobacco hx.    Hearing difficulty?:  Yes, saw Audiology   Immunization History   Administered Date(s) Administered    COVID-19 (PFIZER), mRNA vacc, 30 mcg/0.3 mL (PF) 04/16/2019, 05/07/2019, 11/21/2019    COVID-19 Bivalent (28YR+)(PFIZER), mRNA vacc, 67mcg/0.3mL 12/28/2020    Covid-19 mRNA Vaccine >=12yo (Pfizer)(Comirnaty) 12/22/2021    Flu Vaccine =>6 Months Quadrivalent PF 12/04/2016, 11/15/2017    Flu Vaccine =>65 YO High-Dose Quadrivalent (PF) 11/21/2019    Flu Vaccine Cell Cult Quad 4+YO (PF)(Flucelvax) 11/25/2018    Flu Vaccine, Quadrivalent, Adjuvanted (Preservative Free) 12/23/2020, 10/26/2021  Flu vaccine, inj unspecified (Historical) 11/12/2018, 12/23/2020    Pneumococcal Vaccine (20-Val) 12/28/2020    Pneumococcal Vaccine (23-Val Adult) 11/25/2019    Td vaccine (>=7YO) PF IM (Decavac, Tenivac) 11/15/2017    Tdap Vaccine 02/07/2004    Zoster Vaccine, Unspecified (Historical) 09/27/2015         >20 y. Lipid panel if increased risk of CHD:  Lab Results   Component Value Date    CHOL 274 (H) 12/28/2020    TRIG 137 12/28/2020    HDL 55 12/28/2020    LDL 203 (H) 12/28/2020    VLDL 27 12/28/2020    NONHDLCHOL 219 12/28/2020    CHOLHDLC 3.3 09/07/2016       If HTN, screen for DM: cmp  If HLD, refer to nutrition for low cholesterol diet:  Screen for obesity:  There is no height or weight on file to calculate BMI.       Overweight (BMI >=25)  is above average; referral written for nutritional counseling.               Lung Cancer (Hcc)          Patient was in her usual state of health until November, 2010 when she began having shoulder pain consistent with adhesive capsulitis. She had had adhesive capsulitis in the other shoulder and had to have it treated under anesthetic. She has had progressive pain which interferes with her sleep and activities. An xray was done showing a spiculated pulmonary nodule and she has undergone evaluation.  The nodule is 1.8 by 1.8 and is cavitary. It is in the RLL. She had a needle biopsy which is suggestive of a papillary adenocarcinoma. Her PET showed only the nodule.     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  Histologic Grade: G2: Moderately differentiated  Visceral Pleural Invasion: Not identified  Margins: (Vascular, Bronchial, Parenchymal)  Margins uninvolved by invasive carcinoma  Distance of invasive carcinoma from closest margin: 3 cm  Specify margin: Bronchial margin.  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  01/25/12: no radiology or lab testing done, will repeat at next visit in 6 months. She is doing well, no new complaints  07/25/2012 CT stable. Exam WNL. She is asymptomatic. Will see in 6 months.   08/05/2013 Exam and labs are stable.  She is asymptomatic.  She no longer needs to be seen by oncology and can continue regular follow up with her PCP.                            Ample opportunity given for questions and answers.     No follow-ups on file.   Patient Counseling:  Melinda Goodman was seen today for annual wellness visit and physical.    Diagnoses and all orders for this visit:    Well woman exam  -  COMPREHENSIVE METABOLIC PANEL; Future; Expected date: 04/03/2024  -     LIPID PROFILE; Future; Expected date: 04/03/2024  -     CBC; Future; Expected date: 04/03/2024  -     TSH WITH FREE T4 REFLEX; Future; Expected date: 04/03/2024    Hyperlipidemia, unspecified hyperlipidemia type  -     COMPREHENSIVE METABOLIC PANEL; Future; Expected date: 04/03/2024  -     LIPID PROFILE; Future; Expected date: 04/03/2024  - CBC; Future; Expected date: 04/03/2024  -     TSH WITH FREE T4 REFLEX; Future; Expected date: 04/03/2024    Visit for preventive health examination    Encounter for Medicare annual wellness exam    Malignant neoplasm of lower lobe of right lung (HCC)    Anemia, unspecified type  -     COMPREHENSIVE METABOLIC PANEL; Future; Expected date: 04/03/2024  -     LIPID PROFILE; Future; Expected date: 04/03/2024  -     CBC; Future; Expected date: 04/03/2024  -     TSH WITH FREE T4 REFLEX; Future; Expected date: 04/03/2024    Osteopenia, unspecified location    Other emphysema (HCC)    Chronic migraine without aura without status migrainosus, not intractable    Breast cancer screening by mammogram    BMI 22.0-22.9, adult    Other orders  -     varicella-zoster gE-AS01B (PF) (SHINGRIX (PF)) 50 mcg/0.5 mL injection; Inject 0.5 mL into the muscle once for 1 dose. IM: 0.5 mL administered as a 2-dose series at 0 and 2 to 6 months  Fax verification of vaccination to [phone]  -     FLU VACCINE =>65 YO HIGH-DOSE TRIVALENT PF  -     Cancel: COVID-19 VACCINE (>=12YO) (MODERNA) MRNA PF    .

## 2023-04-04 NOTE — Progress Notes
 History of Present Illness   Melinda Goodman is a 68 y.o. female.    The patient presents for an annual physical exam.    She experienced a prolonged illness with coughing and chest congestion, described as 'nasty,' with a recurrence of green sputum. Mucinex DM was used, which eventually alleviated the symptoms. No current cough, chest pain, or shortness of breath.    She experiences migraines associated with stress or traumatic events. She takes Topamax and Imitrex twice daily for management. Consuming more than two alcoholic drinks can trigger a migraine.    She has a history of multiple urinary tract infections (UTIs) in the past, occurring monthly, but has not experienced any recent episodes. She is scheduled to see a specialist for further evaluation.    She has experienced a couple of falls, one related to alcohol consumption and another due to tripping while wearing unfamiliar shoes. She does not express concern about these incidents, describing them as incidental. No dizziness, lightheadedness, or feeling like she could pass out.    She has a history of lung surgery in 2010 and reports occasional pain at the surgical site, which she attributes to scar tissue. The pain is described as cramping and localized to the area of the surgery.          Computed FIB-4 Calculation unavailable. One or more values for this score either were not found within the given timeframe or did not fit some other criterion.    <1.45 had a negative predictive value of 90% for advanced fibrosis  FIB-4 >3.25 would have a 97% specificity and a positive predictive value of 65% for advanced fibrosis.     The 10-year ASCVD risk score (Arnett DK, et al., 2019) is: 6.5%    Values used to calculate the score:      Age: 25 years      Sex: Female      Is Non-Hispanic African American: No      Diabetic: No      Tobacco smoker: No      Systolic Blood Pressure: 110 mmHg      Is BP treated: No      HDL Cholesterol: 55 MG/DL      Total Cholesterol: 274 MG/DL     No results found for: LIPOPROTA      DIAGNOSTIC  Colonoscopy: One polyp (2023)  EKG: Normal rhythm (04/04/2023)    PATHOLOGY  One polyp, follow-up in ten years (2023)           RDW >14% indicates CVD risk:  RDW   Date Value Ref Range Status   12/28/2020 13.5 11 - 15 % Final   11/25/2019 12.8 11 - 15 % Final   11/18/2018 13.6 11 - 15 % Final   08/05/2013 13.2 11 - 15 % Final   07/25/2012 12.9 11 - 15 % Final   07/26/2011 13.1 11 - 15 % Final   01/17/2011 12.7 11 - 15 % Final   01/12/2010 12.6 11 - 15 % Final   03/20/2009 12.4 11 - 15 % Final   03/18/2009 12.5 11 - 15 % Final       Primary prevention Aspirin recommendation:   Adults aged 79 to 61 years with a 10% or greater 10-year cardiovascular disease (CVD) risk.   Adults 60 years or older The USPSTF recommends against initiating low-dose aspirin use for the primary prevention of CVD in adults 60 years or older.    Kidney failure risk equation:  Computed [facility] 5-Year unavailable. One or more values for this score either were not found within the given timeframe or did not fit some other criterion.     Depression Screening:  Patient Scores:  PHQ-2: PHQ-2 Score: (Patient-Rptd) 0 (04/01/2023  9:48 PM)   PHQ-9: No data recorded  Interventions:  PHQ-2: PHQ-2 Score less than 3: No follow-up or recommendations are necessary at this time (04/01/2023  9:48 PM)   Depression Interventions PHQ-2/9: Interventions: No follow-up or recommendations are necessary at this time           Health Maintenance Due   Topic Date Due    SHINGLES RECOMBINANT VACCINE (1 of 2) 10/30/2004    RSV VACCINE (60 years and Older/Pregnant Women) (1 - Risk 60-74 years 1-dose series) Never done    COVID-19 VACCINE (6 - 2024-25 season) 10/08/2022    BREAST CANCER SCREENING  04/17/2023      Future Appointments   Date Time Provider Department Center   04/04/2024 11:00 AM Werner Lean, MD [facility]                 Objective:          atorvastatin (LIPITOR) 40 mg tablet Take one tablet by mouth at bedtime daily.    escitalopram oxalate (LEXAPRO) 20 mg tablet Take one tablet by mouth daily.    fexofenadine(+) (ALLEGRA) 180 mg PO tablet Take one tablet by mouth every morning.    ibuprofen/diphenhydramine cit (ADVIL PM PO) Take  by mouth.    SUMAtriptan succinate (IMITREX) 25 mg tablet Take one tablet by mouth at onset of headache. May repeat after 2 hours if needed. Max of 200 mg in 24 hours.    topiramate (TOPAMAX) 100 mg tablet Take one tablet by mouth twice daily.    varicella-zoster gE-AS01B (PF) (SHINGRIX (PF)) 50 mcg/0.5 mL injection Inject 0.5 mL into the muscle once for 1 dose. IM: 0.5 mL administered as a 2-dose series at 0 and 2 to 6 months  Fax verification of vaccination to [phone]     Vitals:    04/04/23 1252 04/04/23 1325   BP: 117/90 110/78   BP Source:  Arm, Left Upper   Pulse: 70    Temp: 36.4 ?C (97.5 ?F)    Resp: 18    TempSrc: Oral    PainSc: Zero    Weight: 65.9 kg (145 lb 3.2 oz)    Height: 170.2 cm (5' 7)      Vitals:    04/04/23 1252 04/04/23 1325   BP: 117/90 110/78   BP Source:  Arm, Left Upper   Pulse: 70    Temp: 36.4 ?C (97.5 ?F)    Resp: 18    TempSrc: Oral    PainSc: Zero    Weight: 65.9 kg (145 lb 3.2 oz)    Height: 170.2 cm (5' 7)        Body mass index is 22.74 kg/m?Marland Kitchen     Physical Exam  Vitals and nursing note reviewed.   Constitutional:       Appearance: She is well-developed.   HENT:      Head: Normocephalic and atraumatic.   Neck:      Thyroid: No thyromegaly.      Vascular: No JVD.      Trachea: No tracheal deviation.   Cardiovascular:      Rate and Rhythm: Normal rate and regular rhythm.      Heart sounds: Normal heart sounds.   Pulmonary:  Effort: Pulmonary effort is normal. No respiratory distress.      Breath sounds: Normal breath sounds. No wheezing or rales.   Chest:      Chest wall: No tenderness.   Abdominal:      General: Bowel sounds are normal. There is no distension.      Palpations: Abdomen is soft. There is no mass. Tenderness: There is no abdominal tenderness. There is no guarding or rebound.   Musculoskeletal:      Cervical back: Normal range of motion and neck supple.   Lymphadenopathy:      Cervical: No cervical adenopathy.   Skin:     General: Skin is warm and dry.   Neurological:      Mental Status: She is alert and oriented to person, place, and time.   Psychiatric:         Behavior: Behavior normal.         Thought Content: Thought content normal.         Judgment: Judgment normal.           VITALS: BP- 110/70  CARDIOVASCULAR: Heart sounds normal, regular rhythm.            Labwork reviewed:  Lab Results   Component Value Date/Time    HGBA1C 4.9 03/12/2009 02:16 PM    TSH 4.05 12/28/2020 10:39 AM    CHOL 274 (H) 12/28/2020 10:39 AM    TRIG 137 12/28/2020 10:39 AM    HDL 55 12/28/2020 10:39 AM    LDL 203 (H) 12/28/2020 10:39 AM    NA 141 12/28/2020 10:39 AM    K 4.0 12/28/2020 10:39 AM    CL 109 12/28/2020 10:39 AM    CO2 24 12/28/2020 10:39 AM    GAP 8 12/28/2020 10:39 AM    BUN 17 12/28/2020 10:39 AM    CR 1.10 (H) 12/28/2020 10:39 AM    GLU 81 12/28/2020 10:39 AM    CA 9.3 12/28/2020 10:39 AM    ALBUMIN 4.2 12/28/2020 10:39 AM    TOTPROT 7.1 12/28/2020 10:39 AM    ALKPHOS 74 12/28/2020 10:39 AM    AST 17 12/28/2020 10:39 AM    ALT 14 12/28/2020 10:39 AM    TOTBILI 0.4 12/28/2020 10:39 AM    GFR 40 (L) 11/25/2019 03:56 PM    GFRAA 49 (L) 11/25/2019 03:56 PM            Assessment and Plan:      Migraine Headaches  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 Managed with Topamax and Imitrex. Discussed potential benefit of Migralief (riboflavin, magnesium, feverfew). Informed consent provided for Migralief trial.  - Recommend Migralief twice daily  - Continue Topamax and Imitrex as prescribed    Chronic Cough and Chest Congestion  Persistent cough and chest congestion post-infection, resolved with Mucinex DM. No current symptoms. Discussed typical duration of post-infectious cough (6-12 weeks) and potential for recurrence.    Fall Risk  Recent falls attributed to alcohol consumption and awkward situations. No signs of weakness or need for assistance. Discussed safety measures and potential future need for grab bars.    General Health Maintenance  Annual Medicare wellness visit completed. Discussed diet, exercise, and alcohol consumption. Encouraged increased intake of fruits and vegetables. Reviewed need for upcoming screenings and vaccinations.  - Schedule CT scan for lungs on September 27, 2023  - Order mammogram for April 17, 2023  - Administer shingles vaccine  - Encourage increased intake of fruits and  vegetables  - Discuss alcohol consumption and its effects on migraines    Goals of Care  Discussed advanced directives. Prefers full code with no long-term life support, feeding tubes, or tracheostomies. Wishes to avoid prolonged life support and invasive procedures if prognosis is poor.  - Update advanced directive and provide a copy to the clinic  - Document preferences for full code with no long-term life support    Follow-up  - Schedule annual physical for February 2026  - Follow up with urology for recurrent UTIs  - Order labs before next annual physical.          Problem   Other Emphysema (Hcc)      09/27/2022 seen on CT, no current s/s or treatments.   Smoked in College     Osteopenia     12/28/2020 DEXA  Ca, vit D, exercise.      Anemia         CBC w diff    Lab Results   Component Value Date/Time    WBC 5.0 12/28/2020 10:39 AM    RBC 4.10 12/28/2020 10:39 AM    HGB 12.6 12/28/2020 10:39 AM    HCT 37.2 12/28/2020 10:39 AM    MCV 90.6 12/28/2020 10:39 AM    MCH 30.8 12/28/2020 10:39 AM    MCHC 34.0 12/28/2020 10:39 AM    RDW 13.5 12/28/2020 10:39 AM    PLTCT 228 12/28/2020 10:39 AM    MPV 7.0 12/28/2020 10:39 AM    Lab Results   Component Value Date/Time    NEUT 67 11/18/2018 09:28 AM    ANC 3.18 11/18/2018 09:28 AM    LYMA 17 (L) 11/18/2018 09:28 AM    ALC 0.80 (L) 11/18/2018 09:28 AM    MONA 13 (H) 11/18/2018 09:28 AM    AMC 0.60 11/18/2018 09:28 AM    EOSA 2 11/18/2018 09:28 AM    AEC 0.10 11/18/2018 09:28 AM    BASA 1 11/18/2018 09:28 AM    ABC 0.03 11/18/2018 09:28 AM            Hld (Hyperlipidemia)    Hepatic Function    Lab Results   Component Value Date/Time    ALBUMIN 4.2 12/28/2020 10:39 AM    TOTPROT 7.1 12/28/2020 10:39 AM    ALKPHOS 74 12/28/2020 10:39 AM    Lab Results   Component Value Date/Time    AST 17 12/28/2020 10:39 AM    ALT 14 12/28/2020 10:39 AM    TOTBILI 0.4 12/28/2020 10:39 AM        Lab Results   Component Value Date    CHOL 274 (H) 12/28/2020    TRIG 137 12/28/2020    HDL 55 12/28/2020    LDL 203 (H) 12/28/2020    VLDL 27 12/28/2020    NONHDLCHOL 219 12/28/2020    CHOLHDLC 3.3 09/07/2016       BP Readings from Last 3 Encounters:   01/24/23 112/78   12/19/22 116/84   03/29/22 99/77     67 y.o.   2013 ACC & AHA cholesterol guidelines:   The 10-year ASCVD risk score (Arnett DK, et al., 2019) is: 6.7%    Values used to calculate the score:      Age: 23 years      Sex: Female      Is Non-Hispanic African American: No      Diabetic: No      Tobacco smoker: No      Systolic  Blood Pressure: 112 mmHg      Is BP treated: No      HDL Cholesterol: 55 MG/DL      Total Cholesterol: 274 MG/DL       Advised heart healthy diet and daily exercise.   12/28/2020  Inc to lipitor 40 qhs start.       Recurrent Uti    Gu Upcoming appt.  - no urinary symptoms at this time, though this has been a problem in the past  - encouraged patient to call if develops symptoms     Well Woman Exam    04/04/2023 physical done   09/27/2023 due for 12 mo CT chest       Cervical Cancer screening: only one time abnormal, colposcopy was normal.  Lung Cancer screening: history of lung cancer - released by Dr. Ladona Ridgel 2015     Last screened 11/15/2017 68 y.o., if >65y, will plan annual screening. Depression Screening:  Patient Scores:  PHQ-2: PHQ-2 Score: (Patient-Rptd) 0 (04/01/2023  9:48 PM)      PHQ-9: No data recorded  Interventions:  PHQ-2: No data recorded      Depression Interventions PHQ-2/9: No data recorded    Depression Screening was performed on Marisol Giambra in clinic today. Based on the score of (Patient-Rptd) 0, no follow up action or recommendations are necessary at this time..  Will plan for annual screening.  BILLING 25 modifier, [phone], annually and medicare only   Creatinine clearance: CrCl cannot be calculated (Patient's most recent lab result is older than the maximum 30 days allowed.).  Health Maintenance Due:  Health Maintenance Due   Topic Date Due    MEDICARE ANNUAL WELLNESS VISIT  Never done    SHINGLES RECOMBINANT VACCINE (1 of 2) 10/30/2004    RSV VACCINE (60 years and Older/Pregnant Women) (1 - Risk 60-74 years 1-dose series) Never done    ADVANCE CARE PLANNING DISCUSSION AND DOCUMENTATION  12/28/2021    INFLUENZA VACCINE (1) 09/07/2022    COVID-19 VACCINE (6 - 2024-25 season) 10/08/2022    BREAST CANCER SCREENING  04/17/2023      Health Maintenance completed:  Health Maintenance   Topic Date Due    MEDICARE ANNUAL WELLNESS VISIT  Never done    SHINGLES RECOMBINANT VACCINE (1 of 2) 10/30/2004    RSV VACCINE (60 years and Older/Pregnant Women) (1 - Risk 60-74 years 1-dose series) Never done    ADVANCE CARE PLANNING DISCUSSION AND DOCUMENTATION  12/28/2021    INFLUENZA VACCINE (1) 09/07/2022    COVID-19 VACCINE (6 - 2024-25 season) 10/08/2022    BREAST CANCER SCREENING  04/17/2023    DTAP/TDAP VACCINES (3 - Td or Tdap) 11/16/2027    COLORECTAL CANCER SCREENING  07/22/2031    OSTEOPOROSIS SCREENING/MONITORING  Completed    PNEUMOCOCCAL VACCINE AGE 74 AND OVER  Completed    DEPRESSION SCREENING  Completed    HEPATITIS C SCREENING  Addressed    HPV VACCINES  Aged Out    MENINGOCOCCAL B VACCINE  Aged Out    CERVICAL CANCER SCREENING  Discontinued      Dexa:    DUE at age 3  Last Annual eye exam:  Due for next eye exam:  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 denies tobacco hx.    Hearing difficulty?:  Yes, saw Audiology   Immunization  History   Administered Date(s) Administered    COVID-19 (PFIZER), mRNA vacc, 30 mcg/0.3 mL (PF) 04/16/2019, 05/07/2019, 11/21/2019    COVID-19 Bivalent (42YR+)(PFIZER), mRNA vacc, 34mcg/0.3mL 12/28/2020    Covid-19 mRNA Vaccine >=12yo (Pfizer)(Comirnaty) 12/22/2021    Flu Vaccine =>6 Months Quadrivalent PF 12/04/2016, 11/15/2017    Flu Vaccine =>65 YO High-Dose Quadrivalent (PF) 11/21/2019    Flu Vaccine Cell Cult Quad 4+YO (PF)(Flucelvax) 11/25/2018    Flu Vaccine, Quadrivalent, Adjuvanted (Preservative Free) 12/23/2020, 10/26/2021    Flu vaccine, inj unspecified (Historical) 11/12/2018, 12/23/2020    Pneumococcal Vaccine (20-Val) 12/28/2020    Pneumococcal Vaccine (23-Val Adult) 11/25/2019    Td vaccine (>=7YO) PF IM (Decavac, Tenivac) 11/15/2017    Tdap Vaccine 02/07/2004    Zoster Vaccine, Unspecified (Historical) 09/27/2015         >20 y. Lipid panel if increased risk of CHD:  Lab Results   Component Value Date    CHOL 274 (H) 12/28/2020    TRIG 137 12/28/2020    HDL 55 12/28/2020    LDL 203 (H) 12/28/2020    VLDL 27 12/28/2020    NONHDLCHOL 219 12/28/2020    CHOLHDLC 3.3 09/07/2016       If HTN, screen for DM: cmp  If HLD, refer to nutrition for low cholesterol diet:  Screen for obesity:  There is no height or weight on file to calculate BMI.       Overweight (BMI >=25)  is above average; referral written for nutritional counseling.               Lung Cancer (Hcc)          Patient was in her usual state of health until November, 2010 when she began having shoulder pain consistent with adhesive capsulitis. She had had adhesive capsulitis in the other shoulder and had to have it treated under anesthetic. She has had progressive pain which interferes with her sleep and activities. An xray was done showing a spiculated pulmonary nodule and she has undergone evaluation. The nodule is 1.8 by 1.8 and is cavitary. It is in the RLL. She had a needle biopsy which is suggestive of a papillary adenocarcinoma. Her PET showed only the nodule.     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  Histologic Grade: G2: Moderately differentiated  Visceral Pleural Invasion: Not identified  Margins: (Vascular, Bronchial, Parenchymal)  Margins uninvolved by invasive carcinoma  Distance of invasive carcinoma from closest margin: 3 cm  Specify margin: Bronchial margin.  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  01/25/12: no radiology or lab testing done, will repeat at next visit in 6 months. She is doing well, no new complaints  07/25/2012 CT stable. Exam WNL. She is asymptomatic. Will see in 6 months.   08/05/2013 Exam and labs are stable.  She is asymptomatic.  She no longer needs to be seen by oncology and can  continue regular follow up with her PCP.                            Ample opportunity given for questions and answers.     Return in about 1 year (around 04/03/2024) for MAWV.   Patient Counseling:  Rhoderick Moody was seen today for annual wellness visit and physical.    Diagnoses and all orders for this visit:    Well woman exam  -     COMPREHENSIVE METABOLIC PANEL; Future; Expected date: 04/03/2024  -     LIPID PROFILE; Future; Expected date: 04/03/2024  - CBC; Future; Expected date: 04/03/2024  -     TSH WITH FREE T4 REFLEX; Future; Expected date: 04/03/2024  -     atorvastatin (LIPITOR) 40 mg tablet; Take one tablet by mouth at bedtime daily.  -     escitalopram oxalate (LEXAPRO) 20 mg tablet; Take one tablet by mouth daily.  -     SUMAtriptan succinate (IMITREX) 25 mg tablet; Take one tablet by mouth at onset of headache. May repeat after 2 hours if needed. Max of 200 mg in 24 hours.  -     topiramate (TOPAMAX) 100 mg tablet; Take one tablet by mouth twice daily.  -     AMB REFERRAL TO [facility] [place] [facility]    Hyperlipidemia, unspecified hyperlipidemia type  -     COMPREHENSIVE METABOLIC PANEL; Future; Expected date: 04/03/2024  -     LIPID PROFILE; Future; Expected date: 04/03/2024  -     CBC; Future; Expected date: 04/03/2024  -     TSH WITH FREE T4 REFLEX; Future; Expected date: 04/03/2024  -     AMB REFERRAL TO [facility] [place] [facility]    Visit for preventive health examination  -     AMB REFERRAL TO [facility] [place] [facility]    Encounter for Medicare annual wellness exam  -     AMB REFERRAL TO [facility] [place] [facility]    Malignant neoplasm of lower lobe of right lung (HCC)  -     AMB REFERRAL TO [facility] [place] [facility]    Anemia, unspecified type  -     COMPREHENSIVE METABOLIC PANEL; Future; Expected date: 04/03/2024  -     LIPID PROFILE; Future; Expected date: 04/03/2024  -     CBC; Future; Expected date: 04/03/2024  -     TSH WITH FREE T4 REFLEX; Future; Expected date: 04/03/2024  -     AMB REFERRAL TO [facility] [place] [facility]    Osteopenia, unspecified location  -     AMB REFERRAL TO [facility] [place] [facility]    Other emphysema (HCC)  -     AMB REFERRAL TO [facility] [place] [facility]    Chronic migraine without 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  -     AMB REFERRAL TO [facility] [place] [facility]    Breast cancer screening by mammogram  -     MAMMO SCREEN BILAT/TOMO; Future; Expected date: 04/04/2023  -     AMB REFERRAL TO [facility] [place] [facility]    BMI 22.0-22.9, adult  -     AMB REFERRAL TO [facility] [place] [facility]    Recurrent UTI    Other  orders  -     varicella-zoster gE-AS01B (PF) (SHINGRIX (PF)) 50 mcg/0.5 mL injection; Inject 0.5 mL into the muscle once for 1 dose. IM: 0.5 mL administered as a 2-dose series at 0 and 2 to 6 months  Fax verification of vaccination to [phone]  -     FLU VACCINE =>65 YO HIGH-DOSE TRIVALENT PF  -     Cancel: COVID-19 VACCINE (>=12YO) (MODERNA) MRNA PF    .

## 2023-04-05 DIAGNOSIS — J438 Other emphysema: Secondary | ICD-10-CM

## 2023-04-05 DIAGNOSIS — Z1231 Encounter for screening mammogram for malignant neoplasm of breast: Secondary | ICD-10-CM

## 2023-04-05 DIAGNOSIS — D649 Anemia, unspecified: Secondary | ICD-10-CM

## 2023-04-05 DIAGNOSIS — Z6822 Body mass index (BMI) 22.0-22.9, adult: Secondary | ICD-10-CM

## 2023-06-27 ENCOUNTER — Ambulatory Visit: Admit: 2023-06-27 | Discharge: 2023-06-27 | Payer: MEDICARE

## 2023-06-27 DIAGNOSIS — R112 Nausea with vomiting, unspecified: Secondary | ICD-10-CM

## 2023-06-27 DIAGNOSIS — K529 Noninfective gastroenteritis and colitis, unspecified: Secondary | ICD-10-CM

## 2023-06-27 MED ORDER — ONDANSETRON 8 MG PO TBDI
8 mg | ORAL_TABLET | ORAL | 0 refills | 8.00000 days | Status: AC | PRN
Start: 2023-06-27 — End: ?

## 2023-06-27 NOTE — Progress Notes
 Telehealth Visit Note    Date of Service: 06/27/2023    Subjective:           Melinda Goodman is a 68 y.o. female.    History of Present Illness:  Melinda Goodman is here today for evaluation and treatment of vomiting since yesterday.  She states that she has a bad headache with this and she has a history of migraines but this doesn't feel like it.  She states that she has some nausea right before she is getting ready to throw up but doesn't have it all the time.  She states that she gets nauseated when anything touches her tongue.  She took sumatriptan last night but threw it up twice yesterday along with her night time pills.  She states that she has thrown up even sprite and crackers.                 Objective:          atorvastatin (LIPITOR) 40 mg tablet Take one tablet by mouth at bedtime daily.    escitalopram oxalate (LEXAPRO) 20 mg tablet Take one tablet by mouth daily.    fexofenadine(+) (ALLEGRA) 180 mg PO tablet Take one tablet by mouth every morning.    ibuprofen/diphenhydramine cit (ADVIL PM PO) Take  by mouth.    SUMAtriptan succinate (IMITREX) 25 mg tablet Take one tablet by mouth at onset of headache. May repeat after 2 hours if needed. Max of 200 mg in 24 hours.    topiramate (TOPAMAX) 100 mg tablet Take one tablet by mouth twice daily.           Computed Telehealth Body Mass Index unavailable. One or more values for this score either were not found within the given timeframe or did not fit some other criterion.    Physical Exam  Constitutional:       Appearance: Normal appearance.   HENT:      Head: Normocephalic.      Mouth/Throat:      Lips: Pink.   Eyes:      General: Lids are normal.   Pulmonary:      Effort: Pulmonary effort is normal.   Neurological:      General: No focal deficit present.      Mental Status: She is alert and oriented to person, place, and time.   Psychiatric:         Attention and Perception: Attention and perception normal.         Mood and Affect: Mood and affect normal. Speech: Speech normal.         Behavior: Behavior normal. Behavior is cooperative.         Thought Content: Thought content normal.         Cognition and Memory: Cognition and memory normal.         Judgment: Judgment normal.              Assessment and Plan:  1. Gastroenteritis        2. Nausea and vomiting, unspecified vomiting type          Encounter Medications   Medications    ondansetron (ZOFRAN ODT) 8 mg rapid dissolve tablet     Sig: Dissolve one tablet by mouth every 8 hours as needed. Place on tongue to disolve.     Dispense:  30 tablet     Refill:  0     Collaborating / Supervising Provider:   RING, HOPE A N [[id number]]  Patient Instructions   You were given a prescription for Zofran to take every 8 hours as needed for nausea/vomiting.  You should take the Zofran and wait 20-30 minutes to eat/drink to allow the medication to start working.  Then start with small frequent sips of water and advance your diet as tolerated.  Please eat a bland diet until your symptoms resolve and avoid sugary foods/drinks that can cause diarrhea.  Follow up with your primary care provider or to an in person urgent care if your symptoms worsen, fail to improve, or you have any other concerns.      Please check MyChart for your results which may take up to 72 hours.   We will send a MyChart results message but attempt to call you if you still need to sign up for MyChart.   Information to sign up to MyChart is on your After Visit Summary (AVS)  The MyChart help number is [id number], and the nurse triage number is [phone].

## 2023-06-27 NOTE — Patient Instructions
 You were given a prescription for Zofran to take every 8 hours as needed for nausea/vomiting.  You should take the Zofran and wait 20-30 minutes to eat/drink to allow the medication to start working.  Then start with small frequent sips of water and advance your diet as tolerated.  Please eat a bland diet until your symptoms resolve and avoid sugary foods/drinks that can cause diarrhea.  Follow up with your primary care provider or to an in person urgent care if your symptoms worsen, fail to improve, or you have any other concerns.      Please check MyChart for your results which may take up to 72 hours.   We will send a MyChart results message but attempt to call you if you still need to sign up for MyChart.   Information to sign up to MyChart is on your After Visit Summary (AVS)  The MyChart help number is [phone], and the nurse triage number is [phone].

## 2023-06-29 ENCOUNTER — Encounter: Admit: 2023-06-29 | Discharge: 2023-06-29 | Payer: MEDICARE

## 2023-06-29 DIAGNOSIS — R6889 Other general symptoms and signs: Secondary | ICD-10-CM

## 2023-06-29 DIAGNOSIS — J1001 Influenza due to other identified influenza virus with the same other identified influenza virus pneumonia: Secondary | ICD-10-CM

## 2023-06-29 DIAGNOSIS — G43709 Chronic migraine without aura, not intractable, without status migrainosus: Secondary | ICD-10-CM

## 2023-06-29 DIAGNOSIS — R519 Chronic nonintractable headache, unspecified headache type: Secondary | ICD-10-CM

## 2023-06-29 MED ORDER — KETOROLAC 30 MG/ML (1 ML) IJ SOLN
30 mg | Freq: Once | INTRAMUSCULAR | 0 refills | Status: CP
Start: 2023-06-29 — End: ?
  Administered 2023-06-30: 01:00:00 30 mg via INTRAMUSCULAR

## 2023-06-29 NOTE — Progress Notes
 Date of Service: 06/29/2023    Melinda Goodman is a 68 y.o. female.  DOB: Dec 09, 1955  MRN: [id number]     Subjective:             History of Present Illness  68 year old here with her fianc?Melinda Goodman  Melinda Goodman is history including distant lung cancer, lung nodule, migraine headaches and she is on prophylactic meds    She is taking Imitrex at least 2 days since the onset of this headache Tuesday it does have features that make it familiar for her as migrainous there is been a persistence of the symptoms though.  She also has some Zofran and has taken that for nausea which is improved.  She remembers Toradol as been helpful in the past.    Lots of stress this lingering headache plus she is engaged to be married.       Review of Systems  Distant history of lung cancer.  No known drug allergies.  Stable migraines this feels generally typical but always lasting longer than usual.  She is on suppressive drug with Topamax.  She did try Imitrex and I advised her to hold off on that as it did not provide benefit early    Objective:         ? atorvastatin (LIPITOR) 40 mg tablet Take one tablet by mouth at bedtime daily.   ? escitalopram oxalate (LEXAPRO) 20 mg tablet Take one tablet by mouth daily.   ? fexofenadine(+) (ALLEGRA) 180 mg PO tablet Take one tablet by mouth every morning.   ? ibuprofen/diphenhydramine cit (ADVIL PM PO) Take  by mouth.   ? ondansetron (ZOFRAN ODT) 8 mg rapid dissolve tablet Dissolve one tablet by mouth every 8 hours as needed. Place on tongue to disolve.   ? SUMAtriptan succinate (IMITREX) 25 mg tablet Take one tablet by mouth at onset of headache. May repeat after 2 hours if needed. Max of 200 mg in 24 hours.   ? topiramate (TOPAMAX) 100 mg tablet Take one tablet by mouth twice daily.     Vitals:    06/29/23 1941   BP: 115/83   BP Source: Arm, Right Upper   Pulse: 98   Temp: 98.1 ?F (36.7 ?C)   Resp: 16   SpO2: 98%   TempSrc: Oral   PainSc: Eight   Weight: 62.1 kg (137 lb)   Height: 170.2 cm (5' 7)     Body mass index is 21.46 kg/m?Melinda Goodman     Physical Exam  Constitutional:       General: She is not in acute distress.     Appearance: She is not toxic-appearing.   Pulmonary:      Effort: Pulmonary effort is normal.   Abdominal:      General: There is no distension.   Musculoskeletal:      Cervical back: Neck supple.   Neurological:      Mental Status: She is alert and oriented to person, place, and time.      Motor: No weakness.      Gait: Gait normal.   Psychiatric:         Behavior: Behavior normal.         Thought Content: Thought content normal.   Tired but actually looks generally well.  Good spirits.  Here with her fianc?Melinda Goodman  There are no obvious focal neurological findings.  Neck supple.  No meningeal signs.  PERRLA.  EOMI.  Good range of motion of the neck.  Patellar brachial reflexes  2+ and equal bilateral.  She ambulates without difficulty.  Her color is good.  Respirations unlabored rate checked by me again around 80.    COVID/flu/RSV negative     Assessment and Plan:Melinda Goodman was seen today for headache.    Diagnoses and all orders for this visit:    Chronic nonintractable headache, unspecified headache type  -     POC COV2/FLU/RSV    Flu-like symptoms    Chronic migraine without aura without status migrainosus, not intractable    Influenza due to other identified influenza virus with the same other identified influenza virus pneumonia  -     POC COV2/FLU/RSV    Other orders  -     ketorolac (TORADOL) injection 30 mg    Good discussion with Vertie and her husband and please see the after visit summary instructions. Agreed to the ER should symptoms markedly worsen.  Toradol should give some relief but they understand more testing and evaluation if not resolving here on day 3 /although likely still in a protracted typical migrainous headache ,with possible tension component     Addendum, at 915 I did call and leave a message regarding negative viral testing

## 2023-06-29 NOTE — Patient Instructions
 Melinda Goodman,  For giving you a shot of Toradol which is commonly used for migraine headaches    Also your swabbing for influenza/COVID/RSV.    You can continue Tylenol for headache and also if needed your Zofran for nausea    As we discussed please be seen in the emergency room if you continue to have this persistent headache or if not resolving with the current treatment plan    Regardless also follow-up with your primary care Tuesday

## 2023-06-30 ENCOUNTER — Ambulatory Visit: Admit: 2023-06-30 | Discharge: 2023-06-30 | Payer: MEDICARE

## 2023-10-24 ENCOUNTER — Encounter: Admit: 2023-10-24 | Discharge: 2023-10-24 | Payer: MEDICARE

## 2024-03-03 ENCOUNTER — Encounter: Admit: 2024-03-03 | Discharge: 2024-03-03 | Payer: MEDICARE
